# Patient Record
Sex: Male | Born: 1955 | Race: White | Hispanic: No | Marital: Married | Smoking: Never smoker
Health system: Southern US, Community
[De-identification: ages and names within clinical notes are randomized; demographics above are authoritative.]

## PROBLEM LIST (undated history)

## (undated) DIAGNOSIS — K219 Gastro-esophageal reflux disease without esophagitis: Secondary | ICD-10-CM

## (undated) DIAGNOSIS — I10 Essential (primary) hypertension: Secondary | ICD-10-CM

## (undated) DIAGNOSIS — R519 Headache, unspecified: Secondary | ICD-10-CM

## (undated) DIAGNOSIS — R51 Headache: Secondary | ICD-10-CM

## (undated) HISTORY — PX: TONSILLECTOMY: SUR1361

## (undated) HISTORY — PX: OTHER SURGICAL HISTORY: SHX169

## (undated) HISTORY — PX: EYE SURGERY: SHX253

---

## 2000-02-13 ENCOUNTER — Ambulatory Visit (HOSPITAL_BASED_OUTPATIENT_CLINIC_OR_DEPARTMENT_OTHER): Admission: RE | Admit: 2000-02-13 | Discharge: 2000-02-13 | Payer: Self-pay | Admitting: Orthopedic Surgery

## 2002-11-13 ENCOUNTER — Observation Stay (HOSPITAL_COMMUNITY): Admission: EM | Admit: 2002-11-13 | Discharge: 2002-11-14 | Payer: Self-pay | Admitting: Emergency Medicine

## 2004-03-21 ENCOUNTER — Ambulatory Visit (HOSPITAL_BASED_OUTPATIENT_CLINIC_OR_DEPARTMENT_OTHER): Admission: RE | Admit: 2004-03-21 | Discharge: 2004-03-21 | Payer: Self-pay | Admitting: Otolaryngology

## 2010-09-10 DIAGNOSIS — C4491 Basal cell carcinoma of skin, unspecified: Secondary | ICD-10-CM

## 2010-09-10 HISTORY — DX: Basal cell carcinoma of skin, unspecified: C44.91

## 2010-09-15 DIAGNOSIS — D229 Melanocytic nevi, unspecified: Secondary | ICD-10-CM

## 2010-09-15 HISTORY — DX: Melanocytic nevi, unspecified: D22.9

## 2011-04-21 HISTORY — PX: OTHER SURGICAL HISTORY: SHX169

## 2013-09-18 ENCOUNTER — Ambulatory Visit (INDEPENDENT_AMBULATORY_CARE_PROVIDER_SITE_OTHER): Payer: BC Managed Care – PPO | Admitting: Sports Medicine

## 2013-09-18 ENCOUNTER — Encounter: Payer: Self-pay | Admitting: Sports Medicine

## 2013-09-18 VITALS — BP 134/92 | Ht 75.0 in | Wt 283.0 lb

## 2013-09-18 DIAGNOSIS — M216X9 Other acquired deformities of unspecified foot: Secondary | ICD-10-CM

## 2013-09-18 DIAGNOSIS — M25579 Pain in unspecified ankle and joints of unspecified foot: Secondary | ICD-10-CM

## 2013-09-18 NOTE — Progress Notes (Signed)
   Subjective:    Patient ID: Brent Huynh, male    DOB: [birth year], 58 y.o.   MRN: [id number]  HPI chief complaint: Right foot and ankle pain  Pleasant 58 year old male comes in today complaining of right foot and ankle pain. About a month ago when walking his dog he began to experience some excruciating pain along the plantar aspect of his right foot. That pain eventually resolved but he then developed pain along the lateral aspect of his ankle. He took some time off from walking and as a result his pain has improved. He's had problems with both of his feet in the past. He had a partial Achilles "tear" in the right heel as a teenager. He has a surgical history significant for an ORIF of a left fifth metatarsal stress fracture done by Dr. Percell Miller in 2001. He has not noticed any swelling. He denies associated numbness and tingling. He has taken some over-the-counter ibuprofen which has been helpful. He is also complaining of some chronic intermittent right-sided low back pain with occasional radiating pain down the lateral aspect of his hip. He sees a Restaurant manager, fast food which does seem to help with his pain. He has been given some stretches. No groin pain.  Past medical history reviewed Current medications review Allergies reviewed Nonsmoker, drinks occasional alcohol, works as an Forensic psychologist    Review of Systems As above    Objective:   Physical Exam Well-developed, obese. No acute distress. Awake alert and oriented x3. Vital signs reviewed  Right hip: Smooth painless hip range of motion with a negative log roll. No tenderness to palpation over the greater trochanteric bursa. Negative Faber's. Negative straight leg raise. There is some mild weakness with resisted hip abduction.  Right foot: Significant cavus foot. Limited tibiotalar range of motion actively and passively. Tight Achilles tendon but tendon is intact. Good passive subtalar motion. No soft tissue swelling. No bony or soft tissue  tenderness to palpation. Neurovascularly intact distally. Marked supination with walking.       Assessment & Plan:  Supination  I'm going to start with a pair of green sports insoles with lateral heel wedges. Patient has had custom orthotics in the past which were uncomfortable. These sound like they were rigid orthotics and I think that he may do well with a pair of our custom orthotics eventually. However, today, I will just start with adding the lateral heel wedges and he will followup with me in 4 weeks. Addition of the lateral heel wedges does correct his supination somewhat but it is not a full correction. I've asked him to call me with questions or concerns in the interim. He can continue with activity as tolerated. Of note, I've also given him some piriformis and IT band stretches for his chronic right hip problem.

## 2013-10-16 ENCOUNTER — Ambulatory Visit: Payer: BC Managed Care – PPO | Admitting: Sports Medicine

## 2015-01-09 ENCOUNTER — Ambulatory Visit
Admission: RE | Admit: 2015-01-09 | Discharge: 2015-01-09 | Disposition: A | Discharge status: Home / Self Care | Payer: BLUE CROSS/BLUE SHIELD | Source: Ambulatory Visit | Attending: Internal Medicine | Admitting: Internal Medicine

## 2015-01-09 ENCOUNTER — Other Ambulatory Visit: Payer: Self-pay | Admitting: Internal Medicine

## 2015-01-09 DIAGNOSIS — M5489 Other dorsalgia: Secondary | ICD-10-CM

## 2016-01-15 DIAGNOSIS — Z23 Encounter for immunization: Secondary | ICD-10-CM | POA: Diagnosis not present

## 2016-01-15 DIAGNOSIS — T148 Other injury of unspecified body region: Secondary | ICD-10-CM | POA: Diagnosis not present

## 2016-03-05 DIAGNOSIS — Z7189 Other specified counseling: Secondary | ICD-10-CM | POA: Diagnosis not present

## 2016-03-05 DIAGNOSIS — Z23 Encounter for immunization: Secondary | ICD-10-CM | POA: Diagnosis not present

## 2016-04-28 DIAGNOSIS — F325 Major depressive disorder, single episode, in full remission: Secondary | ICD-10-CM | POA: Diagnosis not present

## 2016-04-28 DIAGNOSIS — R7301 Impaired fasting glucose: Secondary | ICD-10-CM | POA: Diagnosis not present

## 2016-04-28 DIAGNOSIS — E782 Mixed hyperlipidemia: Secondary | ICD-10-CM | POA: Diagnosis not present

## 2016-04-28 DIAGNOSIS — Z0000 Encounter for general adult medical examination without abnormal findings: Secondary | ICD-10-CM | POA: Diagnosis not present

## 2016-04-28 DIAGNOSIS — I10 Essential (primary) hypertension: Secondary | ICD-10-CM | POA: Diagnosis not present

## 2016-04-28 DIAGNOSIS — K219 Gastro-esophageal reflux disease without esophagitis: Secondary | ICD-10-CM | POA: Diagnosis not present

## 2016-06-11 ENCOUNTER — Other Ambulatory Visit: Payer: Self-pay | Admitting: Gastroenterology

## 2016-06-30 DIAGNOSIS — H5213 Myopia, bilateral: Secondary | ICD-10-CM | POA: Diagnosis not present

## 2016-06-30 DIAGNOSIS — H52223 Regular astigmatism, bilateral: Secondary | ICD-10-CM | POA: Diagnosis not present

## 2016-06-30 DIAGNOSIS — H524 Presbyopia: Secondary | ICD-10-CM | POA: Diagnosis not present

## 2016-07-22 ENCOUNTER — Encounter (HOSPITAL_COMMUNITY): Payer: Self-pay | Admitting: *Deleted

## 2016-07-23 ENCOUNTER — Encounter (HOSPITAL_COMMUNITY): Payer: Self-pay | Admitting: *Deleted

## 2016-07-27 ENCOUNTER — Ambulatory Visit (HOSPITAL_COMMUNITY): Payer: BLUE CROSS/BLUE SHIELD | Admitting: Registered Nurse

## 2016-07-27 ENCOUNTER — Ambulatory Visit (HOSPITAL_COMMUNITY)
Admission: RE | Admit: 2016-07-27 | Discharge: 2016-07-27 | Disposition: A | Discharge status: Home / Self Care | Payer: BLUE CROSS/BLUE SHIELD | Source: Ambulatory Visit | Attending: Gastroenterology | Admitting: Gastroenterology

## 2016-07-27 ENCOUNTER — Encounter (HOSPITAL_COMMUNITY): Payer: Self-pay | Admitting: *Deleted

## 2016-07-27 ENCOUNTER — Encounter (HOSPITAL_COMMUNITY)
Admission: RE | Disposition: A | Discharge status: Home / Self Care | Payer: Self-pay | Source: Ambulatory Visit | Attending: Gastroenterology

## 2016-07-27 DIAGNOSIS — D122 Benign neoplasm of ascending colon: Secondary | ICD-10-CM | POA: Diagnosis not present

## 2016-07-27 DIAGNOSIS — E7800 Pure hypercholesterolemia, unspecified: Secondary | ICD-10-CM | POA: Insufficient documentation

## 2016-07-27 DIAGNOSIS — Z86010 Personal history of colonic polyps: Secondary | ICD-10-CM | POA: Insufficient documentation

## 2016-07-27 DIAGNOSIS — Z79899 Other long term (current) drug therapy: Secondary | ICD-10-CM | POA: Insufficient documentation

## 2016-07-27 DIAGNOSIS — Z7982 Long term (current) use of aspirin: Secondary | ICD-10-CM | POA: Insufficient documentation

## 2016-07-27 DIAGNOSIS — J302 Other seasonal allergic rhinitis: Secondary | ICD-10-CM | POA: Diagnosis not present

## 2016-07-27 DIAGNOSIS — Z1211 Encounter for screening for malignant neoplasm of colon: Secondary | ICD-10-CM | POA: Insufficient documentation

## 2016-07-27 DIAGNOSIS — Z888 Allergy status to other drugs, medicaments and biological substances status: Secondary | ICD-10-CM | POA: Diagnosis not present

## 2016-07-27 DIAGNOSIS — K219 Gastro-esophageal reflux disease without esophagitis: Secondary | ICD-10-CM | POA: Diagnosis not present

## 2016-07-27 HISTORY — DX: Headache: R51

## 2016-07-27 HISTORY — DX: Headache, unspecified: R51.9

## 2016-07-27 HISTORY — DX: Gastro-esophageal reflux disease without esophagitis: K21.9

## 2016-07-27 HISTORY — PX: COLONOSCOPY WITH PROPOFOL: SHX5780

## 2016-07-27 SURGERY — COLONOSCOPY WITH PROPOFOL
Anesthesia: Monitor Anesthesia Care

## 2016-07-27 MED ORDER — PROPOFOL 10 MG/ML IV BOLUS
INTRAVENOUS | Status: DC | PRN
  Administered 2016-07-27: 40 mg via INTRAVENOUS

## 2016-07-27 MED ORDER — LACTATED RINGERS IV SOLN
INTRAVENOUS | Status: DC
  Administered 2016-07-27: 1000 mL via INTRAVENOUS

## 2016-07-27 MED ORDER — SODIUM CHLORIDE 0.9 % IV SOLN: INTRAVENOUS | Status: DC

## 2016-07-27 MED ORDER — PROPOFOL 10 MG/ML IV BOLUS
INTRAVENOUS | Status: AC
  Filled 2016-07-27: qty 40

## 2016-07-27 MED ORDER — PROPOFOL 500 MG/50ML IV EMUL
INTRAVENOUS | Status: DC | PRN
  Administered 2016-07-27: 140 ug/kg/min via INTRAVENOUS

## 2016-07-27 MED ORDER — PROPOFOL 10 MG/ML IV BOLUS
INTRAVENOUS | Status: AC
  Filled 2016-07-27: qty 20

## 2016-07-27 SURGICAL SUPPLY — 22 items

## 2016-07-27 NOTE — Op Note (Signed)
[facility] Patient Name: Brent Huynh Procedure Date: 07/27/2016 MRN: [id number] Attending MD: Garlan Fair , MD Date of Birth: Mar 17, 1956 CSN: [id number] Age: 61 Admit Type: Outpatient Procedure:                Colonoscopy Indications:              High risk colon cancer surveillance: Personal                            history of adenoma with villous component Providers:                Garlan Fair, MD, Hilma Favors, RN, Corliss Parish, Technician Referring MD:              Medicines:                Propofol per Anesthesia Complications:            No immediate complications. Estimated Blood Loss:     Estimated blood loss was minimal. Procedure:                Pre-Anesthesia Assessment:                           - Prior to the procedure, a History and Physical                            was performed, and patient medications and                            allergies were reviewed. The patient's tolerance of                            previous anesthesia was also reviewed. The risks                            and benefits of the procedure and the sedation                            options and risks were discussed with the patient.                            All questions were answered, and informed consent                            was obtained. Prior Anticoagulants: The patient has                            taken aspirin, last dose was day of procedure. ASA                            Grade Assessment: II - A patient with mild systemic  disease. After reviewing the risks and benefits,                            the patient was deemed in satisfactory condition to                            undergo the procedure.                           After obtaining informed consent, the colonoscope                            was passed under direct vision. Throughout the                            procedure, the  patient's blood pressure, pulse, and                            oxygen saturations were monitored continuously. The                            was introduced through the anus and advanced to the                            the cecum, identified by appendiceal orifice and                            ileocecal valve. The colonoscopy was performed                            without difficulty. The patient tolerated the                            procedure well. The quality of the bowel                            preparation was good. The appendiceal orifice and                            the rectum were photographed. Scope In: 12:33:14 PM Scope Out: 12:50:44 PM Scope Withdrawal Time: 0 hours 10 minutes 59 seconds  Total Procedure Duration: 0 hours 17 minutes 30 seconds  Findings:      The perianal and digital rectal examinations were normal.      A 4 mm polyp was found in the proximal ascending colon. The polyp was       sessile. The polyp was removed with a cold biopsy forceps. Resection and       retrieval were complete.      The exam was otherwise without abnormality. Impression:               - One 4 mm polyp in the proximal ascending colon,                            removed with a cold biopsy forceps. Resected and  retrieved.                           - The examination was otherwise normal. Moderate Sedation:      N/A- Per Anesthesia Care Recommendation:           - Patient has a contact number available for                            emergencies. The signs and symptoms of potential                            delayed complications were discussed with the                            patient. Return to normal activities tomorrow.                            Written discharge instructions were provided to the                            patient.                           - Repeat colonoscopy in 5 years for surveillance.                           - Resume previous  diet.                           - Continue present medications. Procedure Code(s):        --- Professional ---                           [phone], Colonoscopy, flexible; with biopsy, single                            or multiple Diagnosis Code(s):        --- Professional ---                           Z86.010, Personal history of colonic polyps                           D12.2, Benign neoplasm of ascending colon CPT copyright 2016 American Medical Association. All rights reserved. The codes documented in this report are preliminary and upon coder review may  be revised to meet current compliance requirements. Earle Gell, MD Garlan Fair, MD 07/27/2016 12:57:56 PM This report has been signed electronically. Number of Addenda: 0

## 2016-07-27 NOTE — Transfer of Care (Signed)
Immediate Anesthesia Transfer of Care Note  Patient: Brent Huynh  Procedure(s) Performed: Procedure(s): COLONOSCOPY WITH PROPOFOL (N/A)  Patient Location: PACU and Endoscopy Unit  Anesthesia Type:MAC  Level of Consciousness: awake, alert , oriented and patient cooperative  Airway & Oxygen Therapy: Patient Spontanous Breathing and Patient connected to face mask oxygen  Post-op Assessment: Report given to RN, Post -op Vital signs reviewed and stable and Patient moving all extremities  Post vital signs: Reviewed and stable  Last Vitals:  Vitals:   07/27/16 1120  BP: (!) 159/81  Resp: 13  Temp: 36.8 C    Last Pain:  Vitals:   07/27/16 1120  TempSrc: Oral         Complications: No apparent anesthesia complications

## 2016-07-27 NOTE — Anesthesia Preprocedure Evaluation (Signed)
Anesthesia Evaluation  Patient identified by MRN, date of birth, ID band Patient awake    Reviewed: Allergy & Precautions, NPO status , Patient's Chart, lab work & pertinent test results  Airway Mallampati: I  TM Distance: >3 FB Neck ROM: Full    Dental   Pulmonary    Pulmonary exam normal        Cardiovascular Normal cardiovascular exam     Neuro/Psych    GI/Hepatic GERD  Medicated and Controlled,  Endo/Other    Renal/GU      Musculoskeletal   Abdominal   Peds  Hematology   Anesthesia Other Findings   Reproductive/Obstetrics                             Anesthesia Physical Anesthesia Plan  ASA: II  Anesthesia Plan: MAC   Post-op Pain Management:    Induction: Intravenous  Airway Management Planned: Simple Face Mask  Additional Equipment:   Intra-op Plan:   Post-operative Plan:   Informed Consent: I have reviewed the patients History and Physical, chart, labs and discussed the procedure including the risks, benefits and alternatives for the proposed anesthesia with the patient or authorized representative who has indicated his/her understanding and acceptance.     Plan Discussed with: CRNA and Surgeon  Anesthesia Plan Comments:         Anesthesia Quick Evaluation

## 2016-07-27 NOTE — H&P (Signed)
Procedure: Surveillance colonoscopy  History: The patient is a 61 year old male born 11/07/1955. In 2007, he had a tubulovillous adenomatous polyp removed colonoscopically. On 05/07/2011 he underwent a surveillance colonoscopy with removal of a 5 mm tubular adenomatous descending colon polyp. He is scheduled to undergo a repeat surveillance colonoscopy today.  Past medical history: Gastroesophageal reflux. Hypercholesterolemia. Benign prostatic hypertrophy. Seasonal allergic rhinitis. Obstructive sleep apnea syndrome. Left foot fracture surgery.  Medication allergies: Pepto-Bismol caused hives  Exam: The patient is alert and lying comfortably on the endoscopy stretcher. Abdomen is soft and nontender to palpation. Lungs are clear to auscultation. Cardiac exam reveals a regular rhythm.  Plan: Proceed with surveillance colonoscopy

## 2016-07-27 NOTE — Discharge Instructions (Signed)

## 2016-07-27 NOTE — Anesthesia Postprocedure Evaluation (Signed)
Anesthesia Post Note  Patient: Brent Huynh  Procedure(s) Performed: Procedure(s) (LRB): COLONOSCOPY WITH PROPOFOL (N/A)  Patient location during evaluation: PACU Anesthesia Type: MAC Level of consciousness: awake and alert Pain management: pain level controlled Vital Signs Assessment: post-procedure vital signs reviewed and stable Respiratory status: spontaneous breathing, nonlabored ventilation, respiratory function stable and patient connected to nasal cannula oxygen Cardiovascular status: stable and blood pressure returned to baseline Anesthetic complications: no       Last Vitals:  Vitals:   07/27/16 1315 07/27/16 1320  BP:  127/73  Pulse: (!) 55 (!) 57  Resp: (!) 23 11  Temp:      Last Pain:  Vitals:   07/27/16 1300  TempSrc: Oral                 Adelee Hannula DAVID

## 2016-07-28 ENCOUNTER — Encounter (HOSPITAL_COMMUNITY): Payer: Self-pay | Admitting: Gastroenterology

## 2016-10-27 DIAGNOSIS — R030 Elevated blood-pressure reading, without diagnosis of hypertension: Secondary | ICD-10-CM | POA: Diagnosis not present

## 2016-10-27 DIAGNOSIS — Z23 Encounter for immunization: Secondary | ICD-10-CM | POA: Diagnosis not present

## 2016-11-16 DIAGNOSIS — L729 Follicular cyst of the skin and subcutaneous tissue, unspecified: Secondary | ICD-10-CM | POA: Diagnosis not present

## 2016-12-02 ENCOUNTER — Other Ambulatory Visit: Payer: Self-pay | Admitting: Dermatology

## 2016-12-02 DIAGNOSIS — D492 Neoplasm of unspecified behavior of bone, soft tissue, and skin: Secondary | ICD-10-CM | POA: Diagnosis not present

## 2017-01-15 DIAGNOSIS — Z23 Encounter for immunization: Secondary | ICD-10-CM | POA: Diagnosis not present

## 2017-05-06 ENCOUNTER — Ambulatory Visit
Admission: RE | Admit: 2017-05-06 | Discharge: 2017-05-06 | Disposition: A | Discharge status: Home / Self Care | Payer: BLUE CROSS/BLUE SHIELD | Source: Ambulatory Visit | Attending: Internal Medicine | Admitting: Internal Medicine

## 2017-05-06 ENCOUNTER — Other Ambulatory Visit: Payer: Self-pay | Admitting: Internal Medicine

## 2017-05-06 DIAGNOSIS — M19042 Primary osteoarthritis, left hand: Secondary | ICD-10-CM | POA: Diagnosis not present

## 2017-05-06 DIAGNOSIS — Z0000 Encounter for general adult medical examination without abnormal findings: Secondary | ICD-10-CM | POA: Diagnosis not present

## 2017-05-06 DIAGNOSIS — Z5181 Encounter for therapeutic drug level monitoring: Secondary | ICD-10-CM | POA: Diagnosis not present

## 2017-05-06 DIAGNOSIS — R7301 Impaired fasting glucose: Secondary | ICD-10-CM | POA: Diagnosis not present

## 2017-05-06 DIAGNOSIS — M79642 Pain in left hand: Secondary | ICD-10-CM

## 2017-05-06 DIAGNOSIS — E782 Mixed hyperlipidemia: Secondary | ICD-10-CM | POA: Diagnosis not present

## 2017-05-06 DIAGNOSIS — R002 Palpitations: Secondary | ICD-10-CM | POA: Diagnosis not present

## 2017-05-06 DIAGNOSIS — K219 Gastro-esophageal reflux disease without esophagitis: Secondary | ICD-10-CM | POA: Diagnosis not present

## 2017-05-06 DIAGNOSIS — M79641 Pain in right hand: Secondary | ICD-10-CM | POA: Diagnosis not present

## 2017-05-06 DIAGNOSIS — Z125 Encounter for screening for malignant neoplasm of prostate: Secondary | ICD-10-CM | POA: Diagnosis not present

## 2017-07-06 DIAGNOSIS — H5213 Myopia, bilateral: Secondary | ICD-10-CM | POA: Diagnosis not present

## 2017-07-06 DIAGNOSIS — H52223 Regular astigmatism, bilateral: Secondary | ICD-10-CM | POA: Diagnosis not present

## 2017-07-06 DIAGNOSIS — H524 Presbyopia: Secondary | ICD-10-CM | POA: Diagnosis not present

## 2017-08-27 DIAGNOSIS — M5136 Other intervertebral disc degeneration, lumbar region: Secondary | ICD-10-CM | POA: Diagnosis not present

## 2017-08-27 DIAGNOSIS — M545 Low back pain: Secondary | ICD-10-CM | POA: Diagnosis not present

## 2017-08-27 DIAGNOSIS — M4316 Spondylolisthesis, lumbar region: Secondary | ICD-10-CM | POA: Diagnosis not present

## 2017-09-01 DIAGNOSIS — M545 Low back pain: Secondary | ICD-10-CM | POA: Diagnosis not present

## 2017-09-16 DIAGNOSIS — M545 Low back pain: Secondary | ICD-10-CM | POA: Diagnosis not present

## 2017-09-29 DIAGNOSIS — M545 Low back pain: Secondary | ICD-10-CM | POA: Diagnosis not present

## 2017-10-06 DIAGNOSIS — M545 Low back pain: Secondary | ICD-10-CM | POA: Diagnosis not present

## 2017-10-13 DIAGNOSIS — M545 Low back pain: Secondary | ICD-10-CM | POA: Diagnosis not present

## 2017-10-19 DIAGNOSIS — M545 Low back pain: Secondary | ICD-10-CM | POA: Diagnosis not present

## 2017-10-27 DIAGNOSIS — M545 Low back pain: Secondary | ICD-10-CM | POA: Diagnosis not present

## 2017-11-10 DIAGNOSIS — M545 Low back pain: Secondary | ICD-10-CM | POA: Diagnosis not present

## 2018-01-27 DIAGNOSIS — Z23 Encounter for immunization: Secondary | ICD-10-CM | POA: Diagnosis not present

## 2018-03-29 DIAGNOSIS — T161XXA Foreign body in right ear, initial encounter: Secondary | ICD-10-CM | POA: Diagnosis not present

## 2018-05-23 DIAGNOSIS — E782 Mixed hyperlipidemia: Secondary | ICD-10-CM | POA: Diagnosis not present

## 2018-05-23 DIAGNOSIS — F325 Major depressive disorder, single episode, in full remission: Secondary | ICD-10-CM | POA: Diagnosis not present

## 2018-05-23 DIAGNOSIS — K219 Gastro-esophageal reflux disease without esophagitis: Secondary | ICD-10-CM | POA: Diagnosis not present

## 2018-05-23 DIAGNOSIS — R7301 Impaired fasting glucose: Secondary | ICD-10-CM | POA: Diagnosis not present

## 2018-05-23 DIAGNOSIS — Z0000 Encounter for general adult medical examination without abnormal findings: Secondary | ICD-10-CM | POA: Diagnosis not present

## 2018-05-23 DIAGNOSIS — E669 Obesity, unspecified: Secondary | ICD-10-CM | POA: Diagnosis not present

## 2018-05-23 DIAGNOSIS — Z23 Encounter for immunization: Secondary | ICD-10-CM | POA: Diagnosis not present

## 2018-08-16 DIAGNOSIS — R202 Paresthesia of skin: Secondary | ICD-10-CM | POA: Diagnosis not present

## 2018-08-18 DIAGNOSIS — R739 Hyperglycemia, unspecified: Secondary | ICD-10-CM | POA: Diagnosis not present

## 2018-08-18 DIAGNOSIS — R202 Paresthesia of skin: Secondary | ICD-10-CM | POA: Diagnosis not present

## 2018-08-25 DIAGNOSIS — Z23 Encounter for immunization: Secondary | ICD-10-CM | POA: Diagnosis not present

## 2018-08-25 DIAGNOSIS — E1142 Type 2 diabetes mellitus with diabetic polyneuropathy: Secondary | ICD-10-CM | POA: Diagnosis not present

## 2018-08-25 DIAGNOSIS — N529 Male erectile dysfunction, unspecified: Secondary | ICD-10-CM | POA: Diagnosis not present

## 2018-09-13 DIAGNOSIS — H524 Presbyopia: Secondary | ICD-10-CM | POA: Diagnosis not present

## 2018-09-13 DIAGNOSIS — H52223 Regular astigmatism, bilateral: Secondary | ICD-10-CM | POA: Diagnosis not present

## 2018-09-13 DIAGNOSIS — H5213 Myopia, bilateral: Secondary | ICD-10-CM | POA: Diagnosis not present

## 2018-09-29 DIAGNOSIS — M13841 Other specified arthritis, right hand: Secondary | ICD-10-CM | POA: Diagnosis not present

## 2018-09-29 DIAGNOSIS — M79641 Pain in right hand: Secondary | ICD-10-CM | POA: Diagnosis not present

## 2018-09-29 DIAGNOSIS — G5602 Carpal tunnel syndrome, left upper limb: Secondary | ICD-10-CM | POA: Diagnosis not present

## 2018-10-10 DIAGNOSIS — G5602 Carpal tunnel syndrome, left upper limb: Secondary | ICD-10-CM | POA: Diagnosis not present

## 2018-10-14 DIAGNOSIS — M13841 Other specified arthritis, right hand: Secondary | ICD-10-CM | POA: Diagnosis not present

## 2018-10-14 DIAGNOSIS — G5602 Carpal tunnel syndrome, left upper limb: Secondary | ICD-10-CM | POA: Diagnosis not present

## 2018-11-24 DIAGNOSIS — E1142 Type 2 diabetes mellitus with diabetic polyneuropathy: Secondary | ICD-10-CM | POA: Diagnosis not present

## 2018-11-28 DIAGNOSIS — E1142 Type 2 diabetes mellitus with diabetic polyneuropathy: Secondary | ICD-10-CM | POA: Diagnosis not present

## 2018-11-28 DIAGNOSIS — Z7984 Long term (current) use of oral hypoglycemic drugs: Secondary | ICD-10-CM | POA: Diagnosis not present

## 2019-01-17 DIAGNOSIS — L720 Epidermal cyst: Secondary | ICD-10-CM | POA: Diagnosis not present

## 2019-01-17 DIAGNOSIS — D229 Melanocytic nevi, unspecified: Secondary | ICD-10-CM | POA: Diagnosis not present

## 2019-01-17 DIAGNOSIS — L821 Other seborrheic keratosis: Secondary | ICD-10-CM | POA: Diagnosis not present

## 2019-01-19 DIAGNOSIS — Z23 Encounter for immunization: Secondary | ICD-10-CM | POA: Diagnosis not present

## 2019-03-31 ENCOUNTER — Other Ambulatory Visit: Payer: Self-pay

## 2019-03-31 DIAGNOSIS — Z20822 Contact with and (suspected) exposure to covid-19: Secondary | ICD-10-CM

## 2019-04-03 LAB — NOVEL CORONAVIRUS, NAA: SARS-CoV-2, NAA: NOT DETECTED

## 2019-05-25 DIAGNOSIS — K219 Gastro-esophageal reflux disease without esophagitis: Secondary | ICD-10-CM | POA: Diagnosis not present

## 2019-05-25 DIAGNOSIS — N401 Enlarged prostate with lower urinary tract symptoms: Secondary | ICD-10-CM | POA: Diagnosis not present

## 2019-05-25 DIAGNOSIS — Z125 Encounter for screening for malignant neoplasm of prostate: Secondary | ICD-10-CM | POA: Diagnosis not present

## 2019-05-25 DIAGNOSIS — Z0000 Encounter for general adult medical examination without abnormal findings: Secondary | ICD-10-CM | POA: Diagnosis not present

## 2019-05-25 DIAGNOSIS — E1142 Type 2 diabetes mellitus with diabetic polyneuropathy: Secondary | ICD-10-CM | POA: Diagnosis not present

## 2019-05-25 DIAGNOSIS — E782 Mixed hyperlipidemia: Secondary | ICD-10-CM | POA: Diagnosis not present

## 2019-05-25 DIAGNOSIS — Z1159 Encounter for screening for other viral diseases: Secondary | ICD-10-CM | POA: Diagnosis not present

## 2019-07-13 DIAGNOSIS — Z03818 Encounter for observation for suspected exposure to other biological agents ruled out: Secondary | ICD-10-CM | POA: Diagnosis not present

## 2019-07-13 DIAGNOSIS — Z20828 Contact with and (suspected) exposure to other viral communicable diseases: Secondary | ICD-10-CM | POA: Diagnosis not present

## 2019-07-14 DIAGNOSIS — Z20828 Contact with and (suspected) exposure to other viral communicable diseases: Secondary | ICD-10-CM | POA: Diagnosis not present

## 2019-08-24 DIAGNOSIS — E1142 Type 2 diabetes mellitus with diabetic polyneuropathy: Secondary | ICD-10-CM | POA: Diagnosis not present

## 2019-09-24 IMAGING — DX DG HAND 2V*L*
2 series · 2 of 2 positions shown · non-contrast
Comparison: None.

CLINICAL DATA: Chronic multifocal joint pain.  No known injury.

EXAM:
LEFT HAND - 2 VIEW

[dg hand 2 view left (1 of 2)]
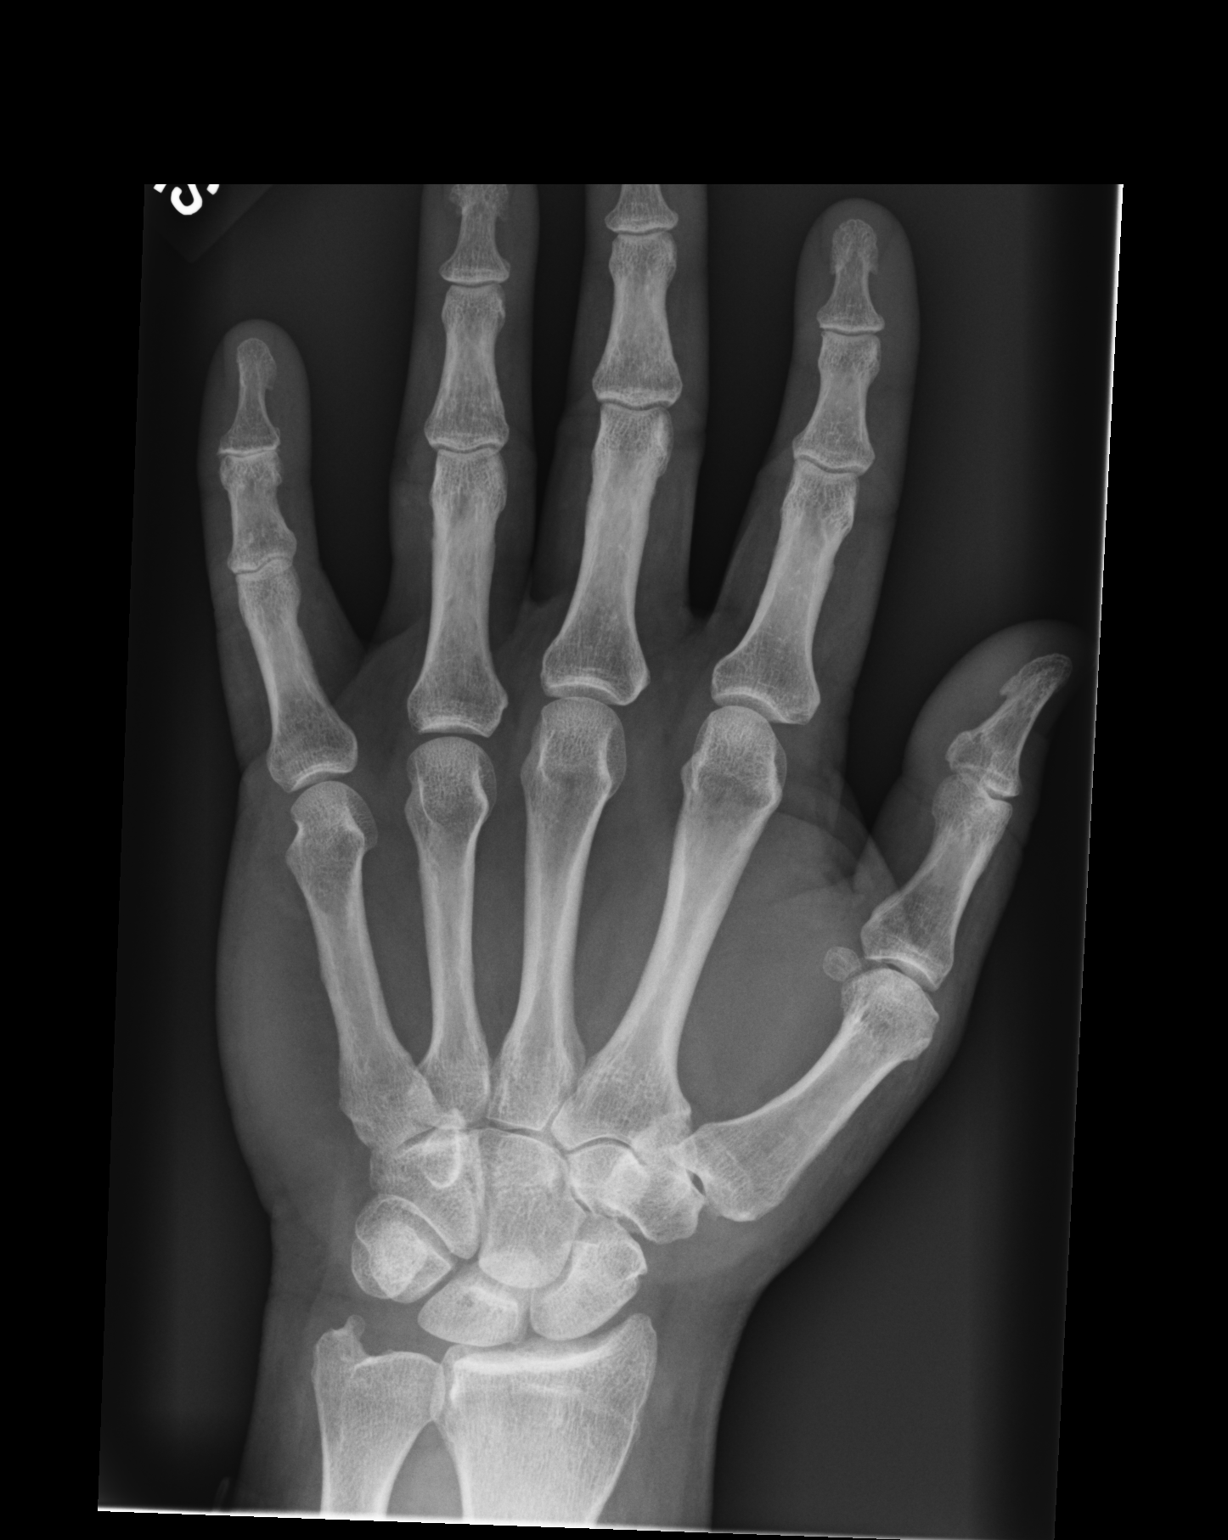

[dg hand 2 view left (2 of 2)]
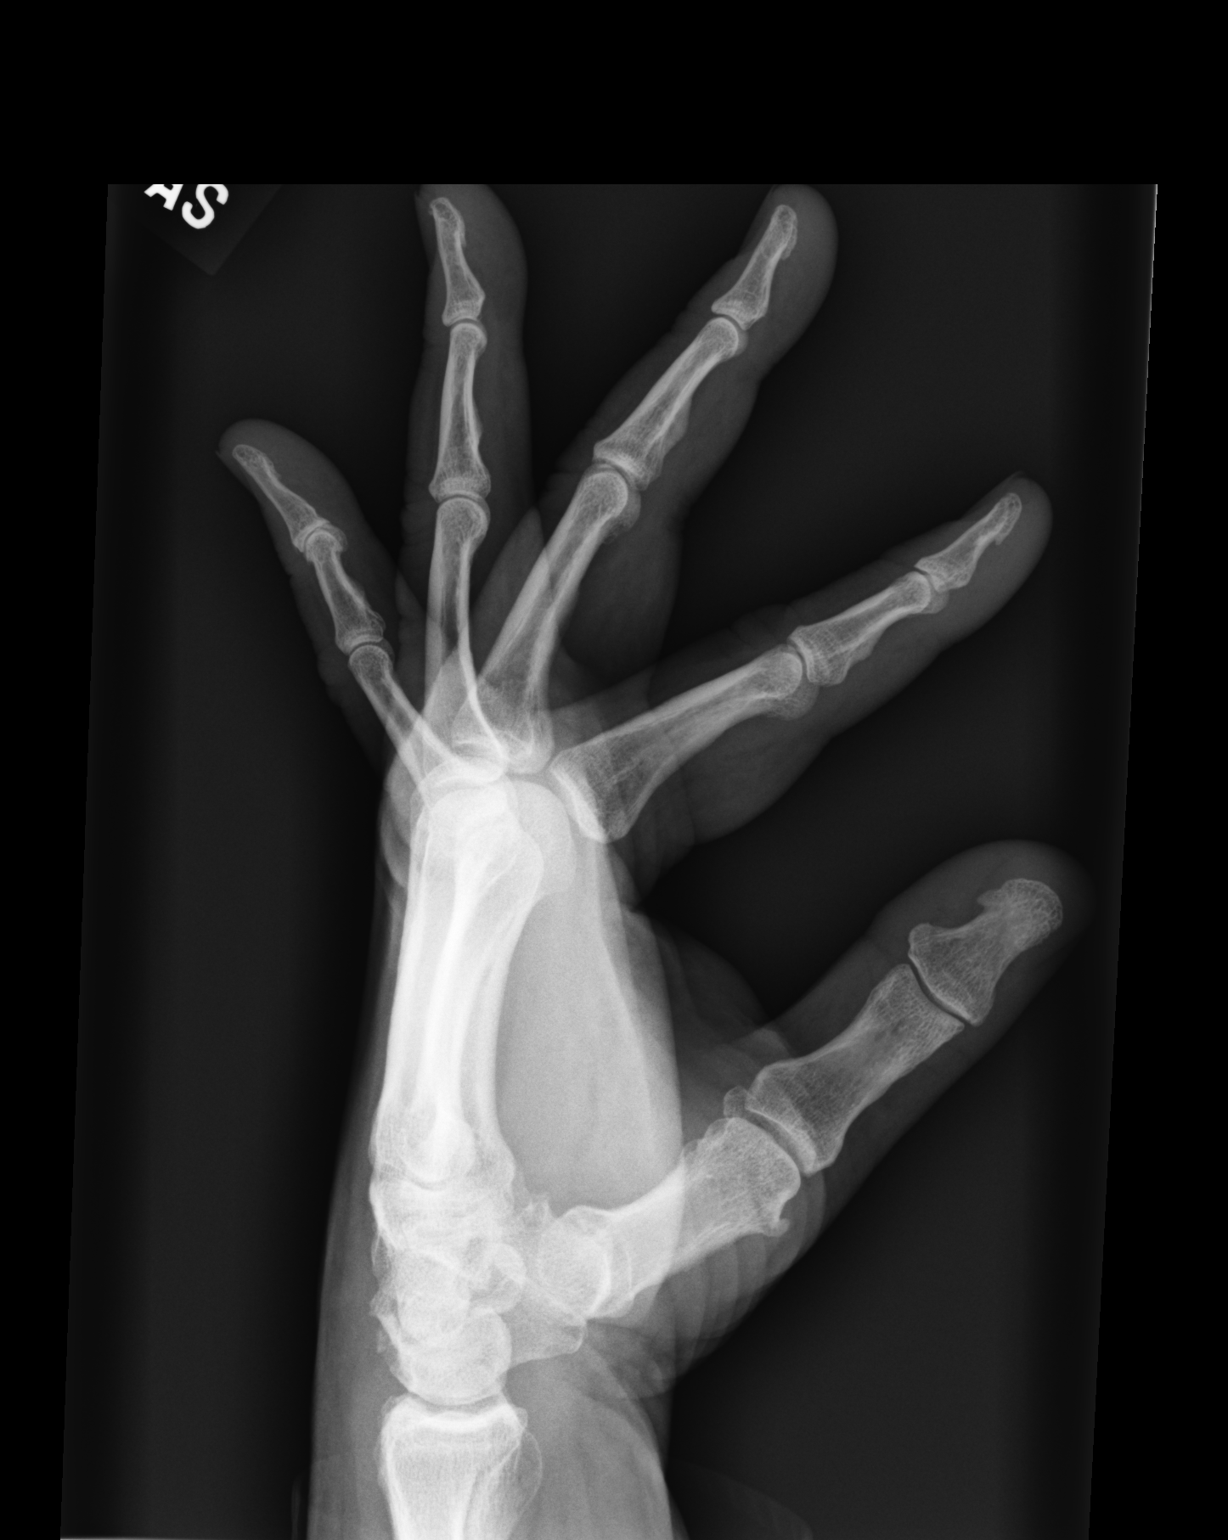

[2 of 2 positions shown; findings below may reference images not displayed]

FINDINGS: There is some joint space narrowing and osteophytosis at the first
CMC joint and DIP joint of the little finger. Joint spaces are
otherwise preserved. Mineralization and alignment are normal. No
erosion or periostitis. No fracture or dislocation. Soft tissues
appear normal.
IMPRESSION: Mild to moderate appearing osteoarthritis DIP joint left little
finger and first CMC joint. The exam is otherwise negative.

## 2019-11-23 DIAGNOSIS — K219 Gastro-esophageal reflux disease without esophagitis: Secondary | ICD-10-CM | POA: Diagnosis not present

## 2019-11-23 DIAGNOSIS — E1142 Type 2 diabetes mellitus with diabetic polyneuropathy: Secondary | ICD-10-CM | POA: Diagnosis not present

## 2019-11-23 DIAGNOSIS — F325 Major depressive disorder, single episode, in full remission: Secondary | ICD-10-CM | POA: Diagnosis not present

## 2020-02-15 ENCOUNTER — Ambulatory Visit (INDEPENDENT_AMBULATORY_CARE_PROVIDER_SITE_OTHER): Payer: BC Managed Care – PPO | Admitting: Sports Medicine

## 2020-02-15 ENCOUNTER — Other Ambulatory Visit: Payer: Self-pay

## 2020-02-15 VITALS — BP 162/97 | Ht 75.0 in | Wt 286.0 lb

## 2020-02-15 DIAGNOSIS — M25562 Pain in left knee: Secondary | ICD-10-CM

## 2020-02-15 MED ORDER — MELOXICAM 15 MG PO TABS: ORAL_TABLET | ORAL | 0 refills | Status: DC

## 2020-02-16 ENCOUNTER — Encounter: Payer: Self-pay | Admitting: Sports Medicine

## 2020-02-16 ENCOUNTER — Ambulatory Visit
Admission: RE | Admit: 2020-02-16 | Discharge: 2020-02-16 | Disposition: A | Discharge status: Home / Self Care | Payer: BC Managed Care – PPO | Source: Ambulatory Visit | Attending: Sports Medicine | Admitting: Sports Medicine

## 2020-02-16 DIAGNOSIS — M1712 Unilateral primary osteoarthritis, left knee: Secondary | ICD-10-CM | POA: Diagnosis not present

## 2020-02-16 DIAGNOSIS — M25562 Pain in left knee: Secondary | ICD-10-CM

## 2020-02-16 NOTE — Progress Notes (Signed)
   Subjective:    Patient ID: Brent Huynh, male    DOB: November 10, 1955, 64 y.o.   MRN: [id number]  HPI chief complaint: Left knee pain  Brent Huynh comes in today complaining of approximately 6 months of intermittent left knee pain.  He first noticed discomfort in the knee when walking in the woods.  He initially had "sharp flashes of pain" which have now subsided.  Now his main complaint is stiffness with prolonged sitting.  He did recently start increasing the amount of walking he was doing for exercise.  He denies any significant pain during exercise.  He denies instability.  No locking, catching, or popping.  He does notice some discomfort when stepping up a stair or going up a hill.  He notices that if he takes Advil before walking this does seem to help.  He is unsure whether or not he is had any swelling.  No prior knee surgeries.  No significant knee injuries in the past.  Past medical history reviewed Medications reviewed Allergies reviewed    Review of Systems    As above Objective:   Physical Exam  Well-developed, well-nourished.  No acute distress.  Awake alert and oriented x3.  Left knee: Full range of motion.  No obvious effusion.  2+ patellofemoral crepitus.  There is no tenderness to palpation along medial or lateral joint lines.  Negative McMurray's.  Negative Thessaly's.  Knee is stable to valgus and varus stressing.  Negative anterior drawer, negative posterior drawer.  Neurovascularly intact distally.  Limited MSK ultrasound of the left knee shows no obvious joint effusion.  No Baker's cyst.  Visualized portion of the medial and lateral menisci appear unremarkable.      Assessment & Plan:   Left knee pain and stiffness likely secondary to DJD  Patient's main complaint is stiffness with prolonged sitting.  This sounds like osteoarthritis of the knee.  I would like to get x-rays to evaluate further.  In the meantime we will go ahead and start treatment with meloxicam 15 mg  daily for 7 days then as needed.  He will take this medicine with food.  I have also recommended a compression sleeve with exercise.  He'll start daily isometric quad exercises and will follow up with me again in 4 weeks.  If symptoms persist, consider merits of cortisone injection.  Call with questions or concerns in the interim.

## 2020-02-27 DIAGNOSIS — E1142 Type 2 diabetes mellitus with diabetic polyneuropathy: Secondary | ICD-10-CM | POA: Diagnosis not present

## 2020-03-13 ENCOUNTER — Other Ambulatory Visit: Payer: Self-pay | Admitting: Sports Medicine

## 2020-03-19 ENCOUNTER — Ambulatory Visit: Payer: BC Managed Care – PPO | Admitting: Sports Medicine

## 2020-03-28 ENCOUNTER — Ambulatory Visit (INDEPENDENT_AMBULATORY_CARE_PROVIDER_SITE_OTHER): Payer: BC Managed Care – PPO | Admitting: Sports Medicine

## 2020-03-28 ENCOUNTER — Other Ambulatory Visit: Payer: Self-pay

## 2020-03-28 VITALS — BP 142/80 | Ht 75.0 in | Wt 285.0 lb

## 2020-03-28 DIAGNOSIS — M1712 Unilateral primary osteoarthritis, left knee: Secondary | ICD-10-CM | POA: Diagnosis not present

## 2020-03-28 NOTE — Progress Notes (Signed)
Patient ID: Zell Hylton, male   DOB: Mar 15, 1956, 64 y.o.   MRN: [id number]  Conard comes in today for follow-up on left knee pain.  Overall, it is improving.  X-rays of his left knee show moderate degenerative changes, primarily in the lateral compartment.  He noticed significant relief when on Mobic for 7 straight days.  He has now weaned to taking it as needed.  He has been compliant with his home exercises and he wears his knee sleeve with activity.  He was able to golf a few days ago without any significant pain.  He still endorses primarily stiffness more so than pain.  Physical exam was not repeated today.  We simply reviewed his x-rays and discussed treatment going forward.  At this point in time I recommend that we continue with our current plan.  I will refill his meloxicam to take as needed and we will add hip abductor strengthening exercises to his isometric quad exercises.  He will continue with his knee sleeve when active.  If his symptoms worsen, he will return to the office for consideration of a cortisone injection.  Follow-up as needed.

## 2020-04-04 ENCOUNTER — Other Ambulatory Visit: Payer: Self-pay | Admitting: Sports Medicine

## 2020-04-08 ENCOUNTER — Other Ambulatory Visit: Payer: Self-pay | Admitting: Sports Medicine

## 2020-04-08 MED ORDER — MELOXICAM 15 MG PO TABS: ORAL_TABLET | ORAL | 0 refills | Status: DC

## 2020-04-22 DIAGNOSIS — H6120 Impacted cerumen, unspecified ear: Secondary | ICD-10-CM | POA: Diagnosis not present

## 2020-04-22 DIAGNOSIS — H9201 Otalgia, right ear: Secondary | ICD-10-CM | POA: Diagnosis not present

## 2020-05-02 DIAGNOSIS — H6123 Impacted cerumen, bilateral: Secondary | ICD-10-CM | POA: Diagnosis not present

## 2020-05-07 DIAGNOSIS — M26621 Arthralgia of right temporomandibular joint: Secondary | ICD-10-CM | POA: Diagnosis not present

## 2020-06-02 ENCOUNTER — Other Ambulatory Visit: Payer: Self-pay | Admitting: Sports Medicine

## 2020-07-03 DIAGNOSIS — K219 Gastro-esophageal reflux disease without esophagitis: Secondary | ICD-10-CM | POA: Diagnosis not present

## 2020-07-03 DIAGNOSIS — F325 Major depressive disorder, single episode, in full remission: Secondary | ICD-10-CM | POA: Diagnosis not present

## 2020-07-03 DIAGNOSIS — E782 Mixed hyperlipidemia: Secondary | ICD-10-CM | POA: Diagnosis not present

## 2020-07-03 DIAGNOSIS — Z0000 Encounter for general adult medical examination without abnormal findings: Secondary | ICD-10-CM | POA: Diagnosis not present

## 2020-07-03 DIAGNOSIS — E1142 Type 2 diabetes mellitus with diabetic polyneuropathy: Secondary | ICD-10-CM | POA: Diagnosis not present

## 2020-07-03 DIAGNOSIS — Z23 Encounter for immunization: Secondary | ICD-10-CM | POA: Diagnosis not present

## 2020-07-25 DIAGNOSIS — E1142 Type 2 diabetes mellitus with diabetic polyneuropathy: Secondary | ICD-10-CM | POA: Diagnosis not present

## 2020-12-02 DIAGNOSIS — U071 COVID-19: Secondary | ICD-10-CM | POA: Diagnosis not present

## 2021-01-07 ENCOUNTER — Ambulatory Visit (INDEPENDENT_AMBULATORY_CARE_PROVIDER_SITE_OTHER): Payer: BC Managed Care – PPO | Admitting: Sports Medicine

## 2021-01-07 ENCOUNTER — Ambulatory Visit
Admission: RE | Admit: 2021-01-07 | Discharge: 2021-01-07 | Disposition: A | Discharge status: Home / Self Care | Payer: BC Managed Care – PPO | Source: Ambulatory Visit | Attending: Sports Medicine | Admitting: Sports Medicine

## 2021-01-07 ENCOUNTER — Other Ambulatory Visit: Payer: Self-pay

## 2021-01-07 ENCOUNTER — Encounter: Payer: Self-pay | Admitting: Sports Medicine

## 2021-01-07 VITALS — Ht 75.0 in | Wt 265.0 lb

## 2021-01-07 DIAGNOSIS — E1142 Type 2 diabetes mellitus with diabetic polyneuropathy: Secondary | ICD-10-CM | POA: Diagnosis not present

## 2021-01-07 DIAGNOSIS — M1712 Unilateral primary osteoarthritis, left knee: Secondary | ICD-10-CM

## 2021-01-07 DIAGNOSIS — M25551 Pain in right hip: Secondary | ICD-10-CM

## 2021-01-07 DIAGNOSIS — N400 Enlarged prostate without lower urinary tract symptoms: Secondary | ICD-10-CM | POA: Diagnosis not present

## 2021-01-07 DIAGNOSIS — S8992XA Unspecified injury of left lower leg, initial encounter: Secondary | ICD-10-CM

## 2021-01-07 DIAGNOSIS — F325 Major depressive disorder, single episode, in full remission: Secondary | ICD-10-CM | POA: Diagnosis not present

## 2021-01-07 DIAGNOSIS — M25462 Effusion, left knee: Secondary | ICD-10-CM | POA: Diagnosis not present

## 2021-01-07 DIAGNOSIS — K219 Gastro-esophageal reflux disease without esophagitis: Secondary | ICD-10-CM | POA: Diagnosis not present

## 2021-01-07 MED ORDER — MELOXICAM 15 MG PO TABS: ORAL_TABLET | ORAL | 0 refills | Status: DC

## 2021-01-07 NOTE — Progress Notes (Signed)
PCP: Lavone Orn, MD  Subjective:   HPI: Patient is a 65 y.o. male here for follow up for knee pain and hip pain.  Left knee pain Patient states that for the last 2 months, he has been experiencing worsening stiffness in his left knee.  He notes that the stiffness occurs especially after prolonged sitting.  He also reports some pain with walking.  He reports that pain is localized to the anterior aspect of his left knee.  He reports that in the past he has had complete relief of his knee pain while taking meloxicam.  He in addition to stiffness, he also feels that his knee has appeared swollen and unstable.  He has been taking ibuprofen over-the-counter for pain as he reports that he was unsure of how he should take the meloxicam as it was not in the acute setting of his pain onset.  Patient also adds that 1 month ago he had a fall in his garage and landed directly on his left knee on a concrete surface.  He reports that he had ecchymosis and swelling at that time and was not evaluated for x-rays following the fall.  He is continue to have pain after this.  Patient reports that he walks 1.5 to 3 miles daily with his dog through the woods and does have pain with this.  Right hip pain Patient reports that he has been having pain in his right hip for the last 2 months.  He reports stiffness and has pain whenever his hips and shoulders are not aligned.  He states that it can be bothersome for him at night.  He reports intermittent groin pain at times but states that most of his pain is lateral.  He states that sometimes while lying on his left side in bed he will feel a stretching sensation in his lateral hip area.  He has groin pain at times with walking or doing physical activity.  He has not had any clunking sounds in his hip.  He denies having any x-rays or advanced imaging of his hip.  Patient denies trauma to the right hip.      Objective:  Physical Exam:  Gen: awake, alert, NAD, comfortable  in exam room Pulm: breathing unlabored  Knee: - Inspection: no gross deformity. No swelling/effusion, erythema or bruising. Skin intact - Palpation: no TTP - ROM: Limited left knee ROM with flexion and extension in comparison to the right, limiting range of motion of right hip internal and external rotation - Strength: 5/5 strength - Neuro/vasc: NV intact - Special Tests: - LIGAMENTS: negative anterior and posterior drawer, negative Lachman's, no MCL or LCL laxity  -- MENISCUS: negative McMurray's, negative Thessaly  -- PF JOINT: nml patellar mobility bilaterally.  negative patellar grind  Hip:  - Inspection: No gross deformity - Palpation: No TTP, specifically none over greater trochanter - ROM: Normal range of motion on Flexion, abduction, internal and external rotation - Strength: Normal strength. - Neuro/vasc: NV intact distally - Special Tests: Negative FABER and FADIR.  Negative Trendelenberg.     Assessment & Plan:    Left knee injury, initial encounter Patient with history of osteoarthritis.  Has been experiencing increased stiffness and pain in anterior knee however recently had a fall landing on left knee so will have patient obtain x-rays today.  For his I feel arthritis, we have discussed steroid injections as long as x-rays are normal.  We will also do another trial of 15 mg of meloxicam  as patient reports complete relief of knee pain with meloxicam before.  Patient planning trip to [place] in the next week.  We will have him follow-up after his return to evaluate knee pain.  We will follow-up with x-ray results once available.  Right hip pain Patient localizes pain to area of greater trochanter more so than groin pain that would increase suspicion for hip pathology.  We will start with x-rays of right hip as well as Tylenol meloxicam.  Patient will follow-up after return from trip to [place].    Orders Placed This Encounter  Procedures   DG Knee AP/LAT W/Sunrise  Left    Standing Status:   Future    Standing Expiration Date:   01/07/2022    Order Specific Question:   Reason for Exam (SYMPTOM  OR DIAGNOSIS REQUIRED)    Answer:   standing AP, lateral and sunrise views    Order Specific Question:   Preferred imaging location?    Answer:   [facility]    Order Specific Question:   Radiology Contrast Protocol - do NOT remove file path    Answer:   [email or web address]   DG HIP UNILAT WITH PELVIS 2-3 VIEWS RIGHT    Standing Status:   Future    Standing Expiration Date:   01/07/2022    Order Specific Question:   Reason for Exam (SYMPTOM  OR DIAGNOSIS REQUIRED)    Answer:   right hip pain    Order Specific Question:   Preferred imaging location?    Answer:   [facility]    Meds ordered this encounter  Medications   meloxicam (MOBIC) 15 MG tablet    Sig: TAKE 1 TABLET BY MOUTH DAILY WITH FOOD AS NEEDED FOR PAIN    Dispense:  30 tablet    Refill:  0    Eulis Foster, MD [place], PGY-3 01/07/2021 11:37 AM   Patient seen and evaluated with the resident.  I agree with the above plan of care.  Review of the patient's left knee x-ray does not show any fracture.  Right hip x-rays do show some moderate degenerative changes.  Treat with meloxicam for the next several days.  If he does not get the same improvement with his left knee pain that he did previously then return to the office for consideration of a cortisone injection.

## 2021-01-07 NOTE — Assessment & Plan Note (Signed)
Patient localizes pain to area of greater trochanter more so than groin pain that would increase suspicion for hip pathology.  We will start with x-rays of right hip as well as Tylenol meloxicam.  Patient will follow-up after return from trip to [place].

## 2021-01-07 NOTE — Assessment & Plan Note (Signed)
Patient with history of osteoarthritis.  Has been experiencing increased stiffness and pain in anterior knee however recently had a fall landing on left knee so will have patient obtain x-rays today.  For his I feel arthritis, we have discussed steroid injections as long as x-rays are normal.  We will also do another trial of 15 mg of meloxicam as patient reports complete relief of knee pain with meloxicam before.  Patient planning trip to [place] in the next week.  We will have him follow-up after his return to evaluate knee pain.  We will follow-up with x-ray results once available.

## 2021-01-07 NOTE — Patient Instructions (Signed)
  Please go to [facility] located at the address below to have x-rays of your knee and hip.  Dr. Micheline Chapman follow-up with you once these results are available.  [facility] [place]   We will also send a prescription for meloxicam 15 mg.  Please take this daily until your follow-up appointment.  You will also need to avoid taking Advil or ibuprofen while taking meloxicam.  Following your return from [place], please call our office to schedule an appointment in the next few weeks to follow-up on your knee and hip pain.  At that time, we will we can further discuss whether he should proceed with MRI of the knee or consider steroid injection.

## 2021-03-10 DIAGNOSIS — R300 Dysuria: Secondary | ICD-10-CM | POA: Diagnosis not present

## 2021-03-21 ENCOUNTER — Other Ambulatory Visit: Payer: Self-pay | Admitting: Sports Medicine

## 2021-03-25 ENCOUNTER — Ambulatory Visit (INDEPENDENT_AMBULATORY_CARE_PROVIDER_SITE_OTHER): Payer: BC Managed Care – PPO | Admitting: Dermatology

## 2021-03-25 ENCOUNTER — Encounter: Payer: Self-pay | Admitting: Dermatology

## 2021-03-25 ENCOUNTER — Other Ambulatory Visit: Payer: Self-pay

## 2021-03-25 DIAGNOSIS — Z1283 Encounter for screening for malignant neoplasm of skin: Secondary | ICD-10-CM | POA: Diagnosis not present

## 2021-03-25 DIAGNOSIS — L720 Epidermal cyst: Secondary | ICD-10-CM

## 2021-03-25 DIAGNOSIS — L821 Other seborrheic keratosis: Secondary | ICD-10-CM | POA: Diagnosis not present

## 2021-03-25 DIAGNOSIS — D1801 Hemangioma of skin and subcutaneous tissue: Secondary | ICD-10-CM

## 2021-03-25 DIAGNOSIS — L814 Other melanin hyperpigmentation: Secondary | ICD-10-CM

## 2021-03-25 DIAGNOSIS — L905 Scar conditions and fibrosis of skin: Secondary | ICD-10-CM

## 2021-03-25 DIAGNOSIS — L738 Other specified follicular disorders: Secondary | ICD-10-CM | POA: Diagnosis not present

## 2021-03-25 DIAGNOSIS — L918 Other hypertrophic disorders of the skin: Secondary | ICD-10-CM

## 2021-04-08 ENCOUNTER — Other Ambulatory Visit: Payer: Self-pay | Admitting: Sports Medicine

## 2021-04-10 ENCOUNTER — Other Ambulatory Visit: Payer: Self-pay | Admitting: Sports Medicine

## 2021-04-13 ENCOUNTER — Encounter: Payer: Self-pay | Admitting: Dermatology

## 2021-04-13 NOTE — Progress Notes (Signed)
° °  Follow-Up Visit   Subjective  Brent Huynh is a 65 y.o. male who presents for the following: Annual Exam (Patient is here for full body skin exam, patient has history of Basal Cell and Atypical mole. Advised patient about skin tags 1-15 230$ cosmetic ).  Annual skin examination, several areas to check Location:  Duration:  Quality:  Associated Signs/Symptoms: Modifying Factors:  Severity:  Timing: Context:   Objective  Well appearing patient in no apparent distress; mood and affect are within normal limits. Mid Back Full body skin exam.  No atypical pigmented lesions, new or recurrent nonmelanoma skin cancer.  Left Upper Back Noninflamed 8 mm deep dermal papule  Left Forehead, Left Lower Leg - Posterior, Right Forehead, Right Lower Leg - Posterior, Torso - Posterior (Back) Multiple brown 4 to 6 mm flattopped textured brown papules, typical dermoscopy  Left Parotid Area 5 mm tan symmetric brown macule, no dermoscopic atypia  Chest (Upper Torso, Anterior), Right Breast Multiple 2 mm smooth red dermal papules  Right Knee - Anterior Slightly thickened scar  Right Axilla Multiple intertriginous skin tags    A full examination was performed including scalp, head, eyes, ears, nose, lips, neck, chest, axillae, abdomen, back, buttocks, bilateral upper extremities, bilateral lower extremities, hands, feet, fingers, toes, fingernails, and toenails. All findings within normal limits unless otherwise noted below.   Assessment & Plan    Screening for malignant neoplasm of skin Mid Back  Yearly skin exam.  Encouraged to self examine twice annually.  Epidermoid cyst Left Upper Back  Benign, ok to leave unless pt wants removed  Seborrheic keratosis (5) Left Lower Leg - Posterior; Right Lower Leg - Posterior; Torso - Posterior (Back); Left Forehead; Right Forehead  No intervention currently necessary  Sebaceous hyperplasia of face (2) Mid Forehead; Right  Forehead  Lentigo Left Parotid Area  Recheck as needed change   Cherry angioma (2) Chest (Upper Torso, Anterior); Right Breast  No intervention necessary  Scar conditions and fibrosis of skin Right Knee - Anterior  Recheck as needed change  Skin tag Right Axilla  Benign, ok to leave unless pt wants removed      I, Brent Monarch, MD, have reviewed all documentation for this visit.  The documentation on 04/13/21 for the exam, diagnosis, procedures, and orders are all accurate and complete.

## 2021-04-16 ENCOUNTER — Other Ambulatory Visit: Payer: Self-pay | Admitting: Sports Medicine

## 2021-05-31 ENCOUNTER — Other Ambulatory Visit: Payer: Self-pay | Admitting: Sports Medicine

## 2021-07-07 ENCOUNTER — Other Ambulatory Visit: Payer: Self-pay | Admitting: Sports Medicine

## 2021-07-10 DIAGNOSIS — F418 Other specified anxiety disorders: Secondary | ICD-10-CM | POA: Diagnosis not present

## 2021-07-10 DIAGNOSIS — Z0000 Encounter for general adult medical examination without abnormal findings: Secondary | ICD-10-CM | POA: Diagnosis not present

## 2021-07-10 DIAGNOSIS — Z125 Encounter for screening for malignant neoplasm of prostate: Secondary | ICD-10-CM | POA: Diagnosis not present

## 2021-07-10 DIAGNOSIS — E782 Mixed hyperlipidemia: Secondary | ICD-10-CM | POA: Diagnosis not present

## 2021-07-10 DIAGNOSIS — Z23 Encounter for immunization: Secondary | ICD-10-CM | POA: Diagnosis not present

## 2021-07-10 DIAGNOSIS — K219 Gastro-esophageal reflux disease without esophagitis: Secondary | ICD-10-CM | POA: Diagnosis not present

## 2021-07-10 DIAGNOSIS — E1142 Type 2 diabetes mellitus with diabetic polyneuropathy: Secondary | ICD-10-CM | POA: Diagnosis not present

## 2021-08-06 DIAGNOSIS — I10 Essential (primary) hypertension: Secondary | ICD-10-CM | POA: Diagnosis not present

## 2021-08-07 ENCOUNTER — Other Ambulatory Visit: Payer: Self-pay | Admitting: Sports Medicine

## 2021-09-17 ENCOUNTER — Other Ambulatory Visit: Payer: Self-pay | Admitting: Sports Medicine

## 2021-09-24 DIAGNOSIS — I10 Essential (primary) hypertension: Secondary | ICD-10-CM | POA: Diagnosis not present

## 2021-12-22 ENCOUNTER — Other Ambulatory Visit: Payer: Self-pay | Admitting: *Deleted

## 2021-12-22 MED ORDER — MELOXICAM 15 MG PO TABS: ORAL_TABLET | ORAL | 0 refills | Status: DC

## 2022-01-07 DIAGNOSIS — I10 Essential (primary) hypertension: Secondary | ICD-10-CM | POA: Diagnosis not present

## 2022-01-07 DIAGNOSIS — F418 Other specified anxiety disorders: Secondary | ICD-10-CM | POA: Diagnosis not present

## 2022-01-07 DIAGNOSIS — F3342 Major depressive disorder, recurrent, in full remission: Secondary | ICD-10-CM | POA: Diagnosis not present

## 2022-01-07 DIAGNOSIS — E1142 Type 2 diabetes mellitus with diabetic polyneuropathy: Secondary | ICD-10-CM | POA: Diagnosis not present

## 2022-02-10 ENCOUNTER — Other Ambulatory Visit: Payer: Self-pay | Admitting: Sports Medicine

## 2022-02-14 ENCOUNTER — Emergency Department (HOSPITAL_COMMUNITY)
Admission: EM | Admit: 2022-02-14 | Discharge: 2022-02-15 | Disposition: A | Discharge status: Home / Self Care | Payer: BC Managed Care – PPO | Attending: Emergency Medicine | Admitting: Emergency Medicine

## 2022-02-14 ENCOUNTER — Emergency Department (HOSPITAL_COMMUNITY): Payer: BC Managed Care – PPO

## 2022-02-14 ENCOUNTER — Other Ambulatory Visit: Payer: Self-pay

## 2022-02-14 ENCOUNTER — Encounter (HOSPITAL_COMMUNITY): Payer: Self-pay

## 2022-02-14 DIAGNOSIS — Z7982 Long term (current) use of aspirin: Secondary | ICD-10-CM | POA: Diagnosis not present

## 2022-02-14 DIAGNOSIS — R079 Chest pain, unspecified: Secondary | ICD-10-CM | POA: Diagnosis not present

## 2022-02-14 DIAGNOSIS — M546 Pain in thoracic spine: Secondary | ICD-10-CM | POA: Diagnosis not present

## 2022-02-14 DIAGNOSIS — M25511 Pain in right shoulder: Secondary | ICD-10-CM | POA: Diagnosis not present

## 2022-02-14 DIAGNOSIS — I10 Essential (primary) hypertension: Secondary | ICD-10-CM | POA: Insufficient documentation

## 2022-02-14 DIAGNOSIS — M549 Dorsalgia, unspecified: Secondary | ICD-10-CM

## 2022-02-14 HISTORY — DX: Essential (primary) hypertension: I10

## 2022-02-14 LAB — CBC
HCT: 42.7 % (ref 39.0–52.0)
Hemoglobin: 15.3 g/dL (ref 13.0–17.0)
MCH: 31.5 pg (ref 26.0–34.0)
MCHC: 35.8 g/dL (ref 30.0–36.0)
MCV: 88 fL (ref 80.0–100.0)
Platelets: 183 10*3/uL (ref 150–400)
RBC: 4.85 MIL/uL (ref 4.22–5.81)
RDW: 13.5 % (ref 11.5–15.5)
WBC: 9.6 10*3/uL (ref 4.0–10.5)
nRBC: 0 % (ref 0.0–0.2)

## 2022-02-14 LAB — COMPREHENSIVE METABOLIC PANEL
ALT: 21 U/L (ref 0–44)
AST: 17 U/L (ref 15–41)
Albumin: 4.1 g/dL (ref 3.5–5.0)
Alkaline Phosphatase: 55 U/L (ref 38–126)
Anion gap: 6 (ref 5–15)
BUN: 18 mg/dL (ref 8–23)
CO2: 27 mmol/L (ref 22–32)
Calcium: 8.7 mg/dL — ABNORMAL LOW (ref 8.9–10.3)
Chloride: 107 mmol/L (ref 98–111)
Creatinine, Ser: 0.77 mg/dL (ref 0.61–1.24)
GFR, Estimated: >60 mL/min (ref >60)
Glucose, Bld: 117 mg/dL — ABNORMAL HIGH (ref 70–99)
Potassium: 4 mmol/L (ref 3.5–5.1)
Sodium: 140 mmol/L (ref 135–145)
Total Bilirubin: 0.8 mg/dL (ref 0.3–1.2)
Total Protein: 7.1 g/dL (ref 6.5–8.1)

## 2022-02-14 LAB — TROPONIN I (HIGH SENSITIVITY)
Troponin I (High Sensitivity): 4 ng/L (ref <18)
Troponin I (High Sensitivity): 4 ng/L (ref <18)

## 2022-02-14 NOTE — ED Triage Notes (Signed)
Patient said he was out walking in the woods and his right shoulder began hurting. He got home and checked his BP and it was 604 systolic.

## 2022-02-14 NOTE — ED Provider Triage Note (Signed)
Emergency Medicine Provider Triage Evaluation Note  Brent Huynh , a 66 y.o. male  was evaluated in triage.  Pt complains of posterior right shoulder pain that is he is walking his dogs in the woods today around 1:00 PM.  He says at the time it radiated to his mid back. He checked his blood pressure and states that it was high for him with systolic I the 381O. He states that he is still having symptoms in his right shoulder. He does report being on blood pressure medication and has been taking this as prescribed.  He denies any shortness of breath, abdominal pain, nausea, vomiting, syncope, or dizziness.  Review of Systems  Positive:  Negative:   Physical Exam  Ht '6\' 3"'$  (1.905 m)   Wt 120 kg   BMI 33.07 kg/m  Gen:   Awake, no distress   Resp:  Normal effort  MSK:   Moves extremities without difficulty  Other:  Abdomen is soft, nontender, negative murphy sign. No respiratory distress. Lungs CTA. Heart sounds normal. Radial pulses equal  bilaterally. No LE edema.   Medical Decision Making  Medically screening exam initiated at 3:42 PM.  Appropriate orders placed.  Robie Mcniel was informed that the remainder of the evaluation will be completed by another provider, this initial triage assessment does not replace that evaluation, and the importance of remaining in the ED until their evaluation is complete.     Adolphus Birchwood, [place] 02/14/22 1545

## 2022-02-14 NOTE — ED Provider Notes (Signed)
[facility]-EMERGENCY DEPT Provider Note   CSN: [id number] Arrival date & time: 02/14/22  1534     History  Chief Complaint  Patient presents with   Hypertension    Brent Huynh is a 66 y.o. male.  66 y/o male with hx of HTN presents to the ED for evaluation of R shoulder pain. Symptoms began at 1300 while patient was walking his dogs in the woods. Describes the pain as an aching sensation. It has been constant and is spontaneously improving. No medications PTA. Pain not improved with rest, not aggravated by activity. He denies significant worsening with movement of the RUE. Took his BP at home which was high in the 170's. He has been compliant with his medications. Denies jaw pain, chest pain, SOB, diaphoresis, abdominal pain, nausea, vomiting, extremity numbness or paresthesias, extremity weakness, syncope/near syncope. Patient denies hx of ACS.  The history is provided by the patient. No language interpreter was used.  Hypertension       Home Medications Prior to Admission medications   Medication Sig Start Date End Date Taking? Authorizing Provider  ALPRAZolam Prudy Feeler) 0.5 MG tablet Take 0.25-0.5 mg by mouth 2 (two) times daily as needed for anxiety.  06/30/13   [provider]  aspirin EC 81 MG tablet Take 81 mg by mouth daily.    [provider]  atorvastatin (LIPITOR) 10 MG tablet Take 10 mg by mouth daily.  08/29/13   [provider]  buPROPion (WELLBUTRIN XL) 300 MG 24 hr tablet Take 300 mg by mouth daily.  08/29/13   [provider]  fexofenadine (ALLEGRA) 180 MG tablet Take 180 mg by mouth daily as needed for allergies or rhinitis.    [provider]  ibuprofen (ADVIL,MOTRIN) 200 MG tablet Take 400 mg by mouth daily as needed for headache or moderate pain.    [provider]  meloxicam (MOBIC) 15 MG tablet Take one tab by mouth daily with food as needed 12/22/21   Margaretha Sheffield, Ozzie Hoyle, DO  omeprazole  (PRILOSEC OTC) 20 MG tablet Take 20 mg by mouth daily.    [provider]  sildenafil (REVATIO) 20 MG tablet Take 60 mg by mouth as needed (ED).    [provider]  zolpidem (AMBIEN) 5 MG tablet Take 10 mg by mouth at bedtime.  09/13/13   [provider]      Allergies    Lactose intolerance (gi) and Pepto-bismol [bismuth subsalicylate]    Review of Systems   Review of Systems Ten systems reviewed and are negative for acute change, except as noted in the HPI.    Physical Exam Updated Vital Signs BP 130/80   Pulse (!) 52   Temp 97.9 F (36.6 C) (Oral)   Resp 20   Ht 6\' 3"  (1.905 m)   Wt 120 kg   SpO2 98%   BMI 33.07 kg/m   Physical Exam Vitals and nursing note reviewed.  Constitutional:      General: He is not in acute distress.    Appearance: He is well-developed. He is not diaphoretic.     Comments: Nontoxic appearing and in NAD  HENT:     Head: Normocephalic and atraumatic.  Eyes:     General: No scleral icterus.    Conjunctiva/sclera: Conjunctivae normal.  Cardiovascular:     Rate and Rhythm: Normal rate and regular rhythm.     Pulses: Normal pulses.  Pulmonary:     Effort: Pulmonary effort is normal. No  respiratory distress.     Comments: Respirations even and unlabored Abdominal:     General: There is no distension.     Palpations: Abdomen is soft. There is no mass.     Tenderness: There is no abdominal tenderness. There is no guarding.     Hernia: No hernia is present.     Comments: Soft, nondistended, nontender abdomen  Musculoskeletal:        General: Normal range of motion.     Cervical back: Normal range of motion.     Right lower leg: No edema.     Left lower leg: No edema.  Skin:    General: Skin is warm and dry.     Coloration: Skin is not pale.     Findings: No erythema or rash.  Neurological:     Mental Status: He is alert and oriented to person, place, and time.     Coordination: Coordination normal.  Psychiatric:         Behavior: Behavior normal.     ED Results / Procedures / Treatments   Labs (all labs ordered are listed, but only abnormal results are displayed) Labs Reviewed  COMPREHENSIVE METABOLIC PANEL - Abnormal; Notable for the following components:      Result Value   Glucose, Bld 117 (*)    Calcium 8.7 (*)    All other components within normal limits  CBC  TROPONIN I (HIGH SENSITIVITY)  TROPONIN I (HIGH SENSITIVITY)    EKG EKG Interpretation  Date/Time:  Saturday February 14 2022 15:41:47 EDT Ventricular Rate:  63 PR Interval:  169 QRS Duration: 108 QT Interval:  464 QTC Calculation: 475 R Axis:   -61 Text Interpretation: Sinus rhythm Left anterior fascicular block Abnormal R-wave progression, late transition Baseline wander in lead(s) I aVL Confirmed by Geoffery Lyons ([id number]) on 02/15/2022 12:58:36 AM  Radiology DG Chest Port 1 View  Result Date: 02/14/2022 CLINICAL DATA:  Chest pain EXAM: PORTABLE CHEST 1 VIEW COMPARISON:  None Available. FINDINGS: Cardiac size is within normal limits. There are no signs of pulmonary edema or focal pulmonary consolidation. Crowding of markings in the lower lung fields may be due to poor inspiration. There is no pleural effusion or pneumothorax. IMPRESSION: No active disease. Electronically Signed   By: Ernie Avena M.D.   On: 02/14/2022 15:53    Procedures Procedures    Medications Ordered in ED Medications - No data to display  ED Course/ Medical Decision Making/ A&P                           Medical Decision Making  This patient presents to the ED for concern of posterior R shoulder pain, this involves an extensive number of treatment options, and is a complaint that carries with it a high risk of complications and morbidity.  The differential diagnosis includes PNA vs PTX vs MSK vs PE vs referred pain from biliary pathology   Co morbidities that complicate the patient evaluation  HTN   Additional history  obtained:  External records from outside source obtained and reviewed including prior sports medicine visits for knee and hip pain   Lab Tests:  I Ordered, and personally interpreted labs.  The pertinent results include:  Normal CBC, CMP. Negative troponin x 2.   Imaging Studies ordered:  I ordered imaging studies including CXR  I independently visualized and interpreted imaging which showed no acute cardiopulmonary abnormality; no PTX, PNA, pleural effusion, cardiomegaly. No  mediastinal widening to suggest dissection. I agree with the radiologist interpretation   Cardiac Monitoring:  The patient was maintained on a cardiac monitor.  I personally viewed and interpreted the cardiac monitored which showed an underlying rhythm of: sinus rhythm   Medicines ordered and prescription drug management:  I have reviewed the patients home medicines and have made adjustments as needed   Test Considered:  CT PE study - however, patient's symptoms atypical for PE. Well's PE score is 0 c/w low risk.   Problem List / ED Course:  Patient with posterior R shoulder pain which is spontaneously improving. Associated with HTN at home which has been down trending over ED course.  MSK considered, but difficulty to reproduce pain on exam. Low suspicion for emergent cardiac etiology given reassuring workup today.  Symptoms atypical for ACS. EKG is nonischemic and troponin negative x2.   Chest x-ray without evidence of mediastinal widening to suggest dissection.  No pneumothorax, pneumonia, pleural effusion.   Pulmonary embolus further considered; however, patient without tachycardia, tachypnea, dyspnea, hypoxia. Low risk per Well's PE score. No leukocytosis or LFT changes to suggest referred pain from biliary pathology. No abdominal TTP on exam.   Reevaluation:  After the interventions noted above, I reevaluated the patient and found that they have :improved   Social Determinants of  Health:  Insured patient   Dispostion:  After consideration of the diagnostic results and the patients response to treatment, I feel that the patent would benefit from outpatient PCP follow up for repeat evaluation, BP recheck. Return precautions discussed and provided. Patient discharged in stable condition with no unaddressed concerns.          Final Clinical Impression(s) / ED Diagnoses Final diagnoses:  Upper back pain on right side  Hypertension not at goal    Rx / DC Orders ED Discharge Orders     None         Antony Madura, PA-C 02/15/22 0059    Geoffery Lyons, MD 02/15/22 [phone]

## 2022-02-15 NOTE — Discharge Instructions (Addendum)
Your work-up in the ED today was reassuring.  Your blood pressure is improving.  We recommend that you have your blood pressure rechecked by your primary care doctor within the week.  Your primary care doctor should also reassess your pain if it persists.  Return to the ED for new or concerning symptoms.

## 2022-03-03 DIAGNOSIS — H6123 Impacted cerumen, bilateral: Secondary | ICD-10-CM | POA: Diagnosis not present

## 2022-03-05 ENCOUNTER — Ambulatory Visit (INDEPENDENT_AMBULATORY_CARE_PROVIDER_SITE_OTHER): Payer: BC Managed Care – PPO | Admitting: Sports Medicine

## 2022-03-05 ENCOUNTER — Ambulatory Visit
Admission: RE | Admit: 2022-03-05 | Discharge: 2022-03-05 | Disposition: A | Discharge status: Home / Self Care | Payer: BC Managed Care – PPO | Source: Ambulatory Visit | Attending: Sports Medicine | Admitting: Sports Medicine

## 2022-03-05 VITALS — BP 136/74 | Ht 75.0 in | Wt 262.0 lb

## 2022-03-05 DIAGNOSIS — M25561 Pain in right knee: Secondary | ICD-10-CM

## 2022-03-05 MED ORDER — MELOXICAM 15 MG PO TABS: ORAL_TABLET | ORAL | 0 refills | Status: DC

## 2022-03-05 NOTE — Progress Notes (Signed)
  SUBJECTIVE:   CHIEF COMPLAINT / HPI:   Right knee pain: 66 year old established patient presenting for recent onset of right knee pain which began 3 to 4 months ago.  Notes that it has felt swollen and stiff and moving from a sitting to standing position requires adjustment time.  He notes the pain is anterior and posterior on the left knee and sometimes extends up the thigh and down his shin.  Sometimes it feels unstable and he is careful when walking on nonlevel surfaces.  He feels sharp pain sometimes when walking but generally it is a dull ache even when he is not having any pressure on his leg.  Denies recent trauma.  He has been using a knee sleeve and extra strength Advil which have helped.  His only form of exercise currently is walking his dog.  PERTINENT  PMH / PSH: Reviewed  OBJECTIVE:  BP 136/74   Ht '6\' 3"'$  (1.905 m)   Wt 262 lb (118.8 kg)   BMI 32.75 kg/m  Right knee: No gross deformity, ecchymoses, swelling.  Appreciable crepitus. No TTP. FROM with normal strength. Negative ant/post drawers. Negative valgus/varus testing. Negative lachman. Negative mcmurrays. NV intact distally.   ASSESSMENT/PLAN:  Acute pain of right knee Assessment & Plan: Unremarkable physical exam with the exception of crepitus in combination with history is more consistent with osteoarthritis.  Do not suspect meniscal or ligamentous pathology at this time.  Degenerative changes are noted in x-ray of opposite knee 1 year prior.  Will obtain x-ray for further evaluation in addition to trial of Mobic x7 days and quad/hip exercises.  Follow-up in 4 weeks.  May consider steroid injection for OA at that time.  Orders: -     DG Knee AP/LAT W/Sunrise Right; Future  Other orders -     Meloxicam; Take one tab daily for 7 days with food. Then take as needed.  Dispense: 40 tablet; Refill: 0  Wells Guiles, DO 03/05/2022, 11:18 AM PGY-2, [place]  Patient seen and evaluated with the  resident.  I agree with the above plan of care.  Patient's symptoms are suggestive of patellofemoral osteoarthritis.  We will get an x-ray to evaluate further.  I recommended a 7-day trial of meloxicam coupled with continued use of his compression sleeve and a home exercise program consisting of isometric quad and hip abductor strengthening exercises.  Follow-up with me again in 4 weeks.  We will review his x-rays together at that time.  Future considerations could include further diagnostic imaging or cortisone injection if symptoms persist.  This note was dictated using Dragon naturally speaking software and may contain errors in syntax, spelling, or content which have not been identified prior to signing this note.

## 2022-03-05 NOTE — Assessment & Plan Note (Addendum)
Unremarkable physical exam with the exception of crepitus in combination with history is more consistent with osteoarthritis.  Do not suspect meniscal or ligamentous pathology at this time.  Degenerative changes are noted in x-ray of opposite knee 1 year prior.  Will obtain x-ray for further evaluation in addition to trial of Mobic x7 days and quad/hip exercises.  Follow-up in 4 weeks.  May consider steroid injection for OA at that time.

## 2022-03-16 ENCOUNTER — Other Ambulatory Visit: Payer: Self-pay | Admitting: Sports Medicine

## 2022-03-31 ENCOUNTER — Ambulatory Visit (INDEPENDENT_AMBULATORY_CARE_PROVIDER_SITE_OTHER): Payer: BC Managed Care – PPO | Admitting: Sports Medicine

## 2022-03-31 VITALS — BP 136/80 | Ht 75.25 in | Wt 265.0 lb

## 2022-03-31 DIAGNOSIS — M1711 Unilateral primary osteoarthritis, right knee: Secondary | ICD-10-CM

## 2022-04-01 ENCOUNTER — Ambulatory Visit: Payer: BC Managed Care – PPO | Admitting: Dermatology

## 2022-04-01 NOTE — Progress Notes (Signed)
Patient ID: Brent Huynh, male   DOB: 17-Oct-1955, 66 y.o.   MRN: [id number]  Brent Huynh presents today for follow-up on right knee pain.  He is doing much better.  He found the meloxicam to be helpful.  He has been diligent with his home exercises.  He does note that his knee gets sore after walking if he does not take his meloxicam.  X-rays of his right knee show moderate degenerative changes in the lateral compartment and mild degenerative changes in the patellofemoral compartment.  Physical exam was not repeated today.  We simply talked about treatment going forward.  I recommended that he try a dose of meloxicam prophylactically a few hours before his recreational walks.  He also understands that he can repeat daily meloxicam for up to 5 to 7 days as needed as long as he does not experience any GI upset.  Given his overall improvement I would not recommend any further workup or treatment at this time.  He will follow-up with me as needed.  This note was dictated using Dragon naturally speaking software and may contain errors in syntax, spelling, or content which have not been identified prior to signing this note.

## 2022-04-08 ENCOUNTER — Other Ambulatory Visit: Payer: Self-pay

## 2022-04-08 MED ORDER — MELOXICAM 15 MG PO TABS: ORAL_TABLET | ORAL | 1 refills | Status: AC

## 2022-04-27 ENCOUNTER — Other Ambulatory Visit: Payer: Self-pay

## 2022-04-27 ENCOUNTER — Ambulatory Visit
Admission: RE | Admit: 2022-04-27 | Discharge: 2022-04-27 | Disposition: A | Discharge status: Home / Self Care | Payer: BC Managed Care – PPO | Source: Ambulatory Visit | Attending: Family Medicine | Admitting: Family Medicine

## 2022-04-27 DIAGNOSIS — M25571 Pain in right ankle and joints of right foot: Secondary | ICD-10-CM

## 2022-04-27 NOTE — Progress Notes (Signed)
Pt called requesting appt for right ankle pain s/p fall. Asking for x-ray prior to visit on 05/01/22. Order placed.

## 2022-05-01 ENCOUNTER — Ambulatory Visit (INDEPENDENT_AMBULATORY_CARE_PROVIDER_SITE_OTHER): Payer: BC Managed Care – PPO | Admitting: Family Medicine

## 2022-05-01 VITALS — BP 148/80 | Ht 75.25 in | Wt 260.0 lb

## 2022-05-01 DIAGNOSIS — M25571 Pain in right ankle and joints of right foot: Secondary | ICD-10-CM

## 2022-05-01 NOTE — Patient Instructions (Signed)
You strained your posterior tibialis tendon. Icing 15 minutes at a time 3-4 times a day. Elevate above your heart when possible. Supportive shoes are important when up and walking around. Consider boot if you're struggling - just call us. Meloxicam '15mg'$  daily with food for pain and inflammation - don't take aleve or ibuprofen with this. Ok to take tylenol in addition to meloxicam though. Motion exercises of the ankle to help get the swelling out and work out stiffness. Follow up in 4 weeks.

## 2022-05-01 NOTE — Progress Notes (Incomplete)
   Subjective:   HPI: Patient is a 67 y.o. male here for Right ankle pain.  Patient reports that on January 2nd he was on vacation in [place] and he jumped down from an embankment that was about 55f high and tumbled down a bit. He noticed a crunching sound and pain but he was able to limp on the leg after the injury. He immediately put ice on the area and has been using OTC Advil (about 500-'1000mg'$  2-3 times daily) with some improvement. He has been ambulating with his cane. He felt that it was getting better until the last day or so, he was on his feet more in the last few days but has no other acute injury. He does report history of multiple ankle injuries during his lifetime and that he has never done any rehabilitation after those injuries.   PMH, medications, surgical history, social history, family history and allergies reviewed.     Objective:  Physical Exam: BP (!) 148/80   Ht 6' 3.25" (1.911 m)   Wt 260 lb (117.9 kg)   BMI 32.28 kg/m  Gen: awake, alert, NAD, comfortable in exam room Pulm: breathing unlabored  Right Ankle: - Inspection: Mild swelling of the medial foot distal to the medial malleolus. No ecchymosis, ulcers, or blisters - Palpation: some pain with palpation of the posterior tibial tendon and inferior to the medial malleolus,  non-TTP at the base of the 5th metatarsal, non-tender at the cuboid or navicular region. - Strength: Normal strength with dorsiflexion, plantarflexion, inversion, and eversion of foot; flexion and extension of toes - ROM: Full ROM though some pain with inversion and eversion - Neuro/vasc: NV intact - Special Tests: Negative anterior drawer.  Negative syndesmotic compression. Pain with resisted eversion but no obvious laxity   Feet: no deformity noted.  Normal arch w/I pes cavus or planus, normal arch flexibility.  Normal calcaneal motion with toe-raise.  Right ankle [place] visualized the posterior tibial tendon which demonstrated hyperechoic fluid  surrounding the tendon and adjacent tissues. Tendon was traced down to the insertion and fibers were visualized without defect.    DG Ankle Complete Right Result Date: 04/27/2022 FINDINGS: Moderate tibiotalar joint space narrowing and peripheral osteophytosis, greatest at the anterior joint. Triangular 9 mm well corticated ossicle distal to the medial malleolus, likely the sequela of remote trauma. Tiny plantar and mild posterior calcaneal heel spurs. Mild dorsal talonavicular degenerative osteophytes. No acute fracture is seen. No dislocation.  IMPRESSION: 1. Moderate tibiotalar osteoarthritis. 2. Tiny plantar and mild posterior calcaneal heel spurs.     Assessment & Plan:  1. Right Ankle Pain X-rays were reviewed with moderate tibiotalar OA and calcaneal and plantar heel spurs. Physical exam and history seem more consistent with a posterior tibial tendonitis.  - Continue Advil as needed (or can use Meloxicam if preferred but not both) for pain and inflammation - Tylenol PRN for pain - Ice and compression for swelling - Wear shoes with good arch support - Encouraged exercises such as tracing the alphabet with his foot - If ability to ambulate is not improving, patient can consider using walker boot for a few weeks   ARise Patience DO PGY-3, [place]

## 2022-06-01 ENCOUNTER — Ambulatory Visit: Payer: BC Managed Care – PPO | Admitting: Family Medicine

## 2022-06-03 ENCOUNTER — Other Ambulatory Visit: Payer: Self-pay | Admitting: Sports Medicine

## 2022-07-13 DIAGNOSIS — E1142 Type 2 diabetes mellitus with diabetic polyneuropathy: Secondary | ICD-10-CM | POA: Diagnosis not present

## 2022-07-13 DIAGNOSIS — Z79899 Other long term (current) drug therapy: Secondary | ICD-10-CM | POA: Diagnosis not present

## 2022-07-13 DIAGNOSIS — Z125 Encounter for screening for malignant neoplasm of prostate: Secondary | ICD-10-CM | POA: Diagnosis not present

## 2022-07-13 DIAGNOSIS — Z0000 Encounter for general adult medical examination without abnormal findings: Secondary | ICD-10-CM | POA: Diagnosis not present

## 2022-07-13 DIAGNOSIS — E782 Mixed hyperlipidemia: Secondary | ICD-10-CM | POA: Diagnosis not present

## 2022-09-02 ENCOUNTER — Ambulatory Visit (INDEPENDENT_AMBULATORY_CARE_PROVIDER_SITE_OTHER): Payer: BC Managed Care – PPO | Admitting: Sports Medicine

## 2022-09-02 VITALS — BP 138/84 | Ht 75.25 in | Wt 262.0 lb

## 2022-09-02 DIAGNOSIS — M545 Low back pain, unspecified: Secondary | ICD-10-CM | POA: Diagnosis not present

## 2022-09-02 MED ORDER — MELOXICAM 15 MG PO TABS: 15.0000 mg | ORAL_TABLET | Freq: Every day | ORAL | 1 refills | Status: AC

## 2022-09-02 MED ORDER — METHOCARBAMOL 500 MG PO TABS: 500.0000 mg | ORAL_TABLET | Freq: Three times a day (TID) | ORAL | 0 refills | Status: AC | PRN

## 2022-09-02 NOTE — Progress Notes (Signed)
Established Patient Office Visit  Subjective   Patient ID: Brent Huynh, male    DOB: 1956/02/25  Age: 67 y.o. MRN: [id number]  Bilateral low back pain.  Brent Huynh is here today with chief complaint of bilateral low back pain that began on Sunday.  He was out in [place] for his daughter's graduation sleeping and is slightly uncomfortable bed when he noticed some back pain.  He then flew home which did increase his back pain a little.  His pain was steadily improving however he had a small setback earlier in the week so he made his appointment today.  He reports his pain is pretty constant about the level of a 6 but can get up to an 8.  This pain is worse when he tries to stand back up from bending over.  He denies any numbness or tingling, radiation of pain down his legs or loss of bowel or bladder control.  He has tried ibuprofen with only temporary relief.   ROS as listed above in HPI    Objective:     BP 138/84   Ht 6' 3.25" (1.911 m)   Wt 262 lb (118.8 kg)   BMI 32.53 kg/m   Physical Exam Vitals reviewed.  Constitutional:      General: He is not in acute distress.    Appearance: Normal appearance. He is obese. He is not ill-appearing, toxic-appearing or diaphoretic.  Pulmonary:     Effort: Pulmonary effort is normal.  Skin:    General: Skin is warm.  Neurological:     Mental Status: He is alert.     Gait: Gait normal.  Psychiatric:        Mood and Affect: Mood normal.        Behavior: Behavior normal.        Thought Content: Thought content normal.        Judgment: Judgment normal.   Left low back: Seated comfortably in exam room, quick to transition from seated to standing position.  No ecchymosis edema or rash.  No tenderness to palpation the midline lumbar spine or step-off appreciated.  Some tenderness to palpation of the paraspinals and quadratus lumborum bilaterally.  Full range of motion with flexion extension, and rotation.  Strength 5/5 resisted hip  flexion, resisted knee flexion and extension.  Sensation intact to light touch.  Normal gait.     Assessment & Plan:   Problem List Items Addressed This Visit       Other   Acute bilateral low back pain without sciatica - Primary    Based on his clinical and physical exam today I am most suspicious for musculoskeletal strain of the low back.  I have sent a refill of meloxicam to his pharmacy to use for the next 7 to 14 days as well as a muscle relaxer, Robaxin that he can use up to 3 times a day as needed for spasms.  Did caution him of use with Ambien in the evenings.  He verbalized understanding.  He was also given some gentle stretches and exercises to perform for the next 14 days.  Follow-up with [place] if pain worsens or fails to improve.  At that point he may need formal physical therapy versus further imaging.       Relevant Medications   meloxicam (MOBIC) 15 MG tablet   methocarbamol (ROBAXIN) 500 MG tablet    Return in about 4 weeks (around 09/30/2022), or if symptoms worsen or fail to improve.  Claudie Leach, DO  Addendum:  I was the preceptor for this visit and available for immediate consultation.  Norton Blizzard MD Marrianne Mood

## 2022-09-02 NOTE — Assessment & Plan Note (Signed)
Based on his clinical and physical exam today I am most suspicious for musculoskeletal strain of the low back.  I have sent a refill of meloxicam to his pharmacy to use for the next 7 to 14 days as well as a muscle relaxer, Robaxin that he can use up to 3 times a day as needed for spasms.  Did caution him of use with Ambien in the evenings.  He verbalized understanding.  He was also given some gentle stretches and exercises to perform for the next 14 days.  Follow-up with [place] if pain worsens or fails to improve.  At that point he may need formal physical therapy versus further imaging.

## 2022-09-08 DIAGNOSIS — Z713 Dietary counseling and surveillance: Secondary | ICD-10-CM | POA: Diagnosis not present

## 2022-11-05 DIAGNOSIS — Z86010 Personal history of colonic polyps: Secondary | ICD-10-CM | POA: Diagnosis not present

## 2022-11-05 DIAGNOSIS — D125 Benign neoplasm of sigmoid colon: Secondary | ICD-10-CM | POA: Diagnosis not present

## 2022-11-05 DIAGNOSIS — Z09 Encounter for follow-up examination after completed treatment for conditions other than malignant neoplasm: Secondary | ICD-10-CM | POA: Diagnosis not present

## 2022-11-05 DIAGNOSIS — K5730 Diverticulosis of large intestine without perforation or abscess without bleeding: Secondary | ICD-10-CM | POA: Diagnosis not present

## 2022-11-05 DIAGNOSIS — D123 Benign neoplasm of transverse colon: Secondary | ICD-10-CM | POA: Diagnosis not present

## 2022-11-06 ENCOUNTER — Other Ambulatory Visit: Payer: Self-pay | Admitting: Sports Medicine

## 2022-11-30 DIAGNOSIS — D225 Melanocytic nevi of trunk: Secondary | ICD-10-CM | POA: Diagnosis not present

## 2022-11-30 DIAGNOSIS — L728 Other follicular cysts of the skin and subcutaneous tissue: Secondary | ICD-10-CM | POA: Diagnosis not present

## 2022-11-30 DIAGNOSIS — L814 Other melanin hyperpigmentation: Secondary | ICD-10-CM | POA: Diagnosis not present

## 2022-11-30 DIAGNOSIS — L821 Other seborrheic keratosis: Secondary | ICD-10-CM | POA: Diagnosis not present

## 2023-02-05 DIAGNOSIS — M25551 Pain in right hip: Secondary | ICD-10-CM | POA: Diagnosis not present

## 2023-04-23 DIAGNOSIS — Z23 Encounter for immunization: Secondary | ICD-10-CM | POA: Diagnosis not present

## 2023-04-23 DIAGNOSIS — R399 Unspecified symptoms and signs involving the genitourinary system: Secondary | ICD-10-CM | POA: Diagnosis not present

## 2023-05-08 DIAGNOSIS — F32A Depression, unspecified: Secondary | ICD-10-CM | POA: Diagnosis not present

## 2023-05-08 DIAGNOSIS — I10 Essential (primary) hypertension: Secondary | ICD-10-CM | POA: Diagnosis not present

## 2023-05-08 DIAGNOSIS — K8020 Calculus of gallbladder without cholecystitis without obstruction: Secondary | ICD-10-CM | POA: Diagnosis not present

## 2023-05-08 DIAGNOSIS — W010XXA Fall on same level from slipping, tripping and stumbling without subsequent striking against object, initial encounter: Secondary | ICD-10-CM | POA: Diagnosis not present

## 2023-05-08 DIAGNOSIS — S20211A Contusion of right front wall of thorax, initial encounter: Secondary | ICD-10-CM | POA: Diagnosis not present

## 2023-05-08 DIAGNOSIS — R0781 Pleurodynia: Secondary | ICD-10-CM | POA: Diagnosis not present

## 2023-05-08 DIAGNOSIS — E7800 Pure hypercholesterolemia, unspecified: Secondary | ICD-10-CM | POA: Diagnosis not present

## 2023-05-08 DIAGNOSIS — J939 Pneumothorax, unspecified: Secondary | ICD-10-CM | POA: Diagnosis not present

## 2023-05-08 DIAGNOSIS — Y92830 Public park as the place of occurrence of the external cause: Secondary | ICD-10-CM | POA: Diagnosis not present

## 2023-05-08 DIAGNOSIS — S20219A Contusion of unspecified front wall of thorax, initial encounter: Secondary | ICD-10-CM | POA: Diagnosis not present

## 2023-05-08 DIAGNOSIS — R918 Other nonspecific abnormal finding of lung field: Secondary | ICD-10-CM | POA: Diagnosis not present

## 2023-05-09 DIAGNOSIS — R0789 Other chest pain: Secondary | ICD-10-CM | POA: Diagnosis not present

## 2023-05-20 DIAGNOSIS — R0781 Pleurodynia: Secondary | ICD-10-CM | POA: Diagnosis not present

## 2023-05-25 DIAGNOSIS — R0781 Pleurodynia: Secondary | ICD-10-CM | POA: Diagnosis not present

## 2023-05-28 IMAGING — DX DG KNEE AP/LAT W/ SUNRISE*L*
3 series · 3 of 3 positions shown · non-contrast
Comparison: 02/16/2020.

CLINICAL DATA: Osteoarthritis left knee.

EXAM:
LEFT KNEE 3 VIEWS

[dg knee ap/lat w/ sunrise left (1 of 3)]
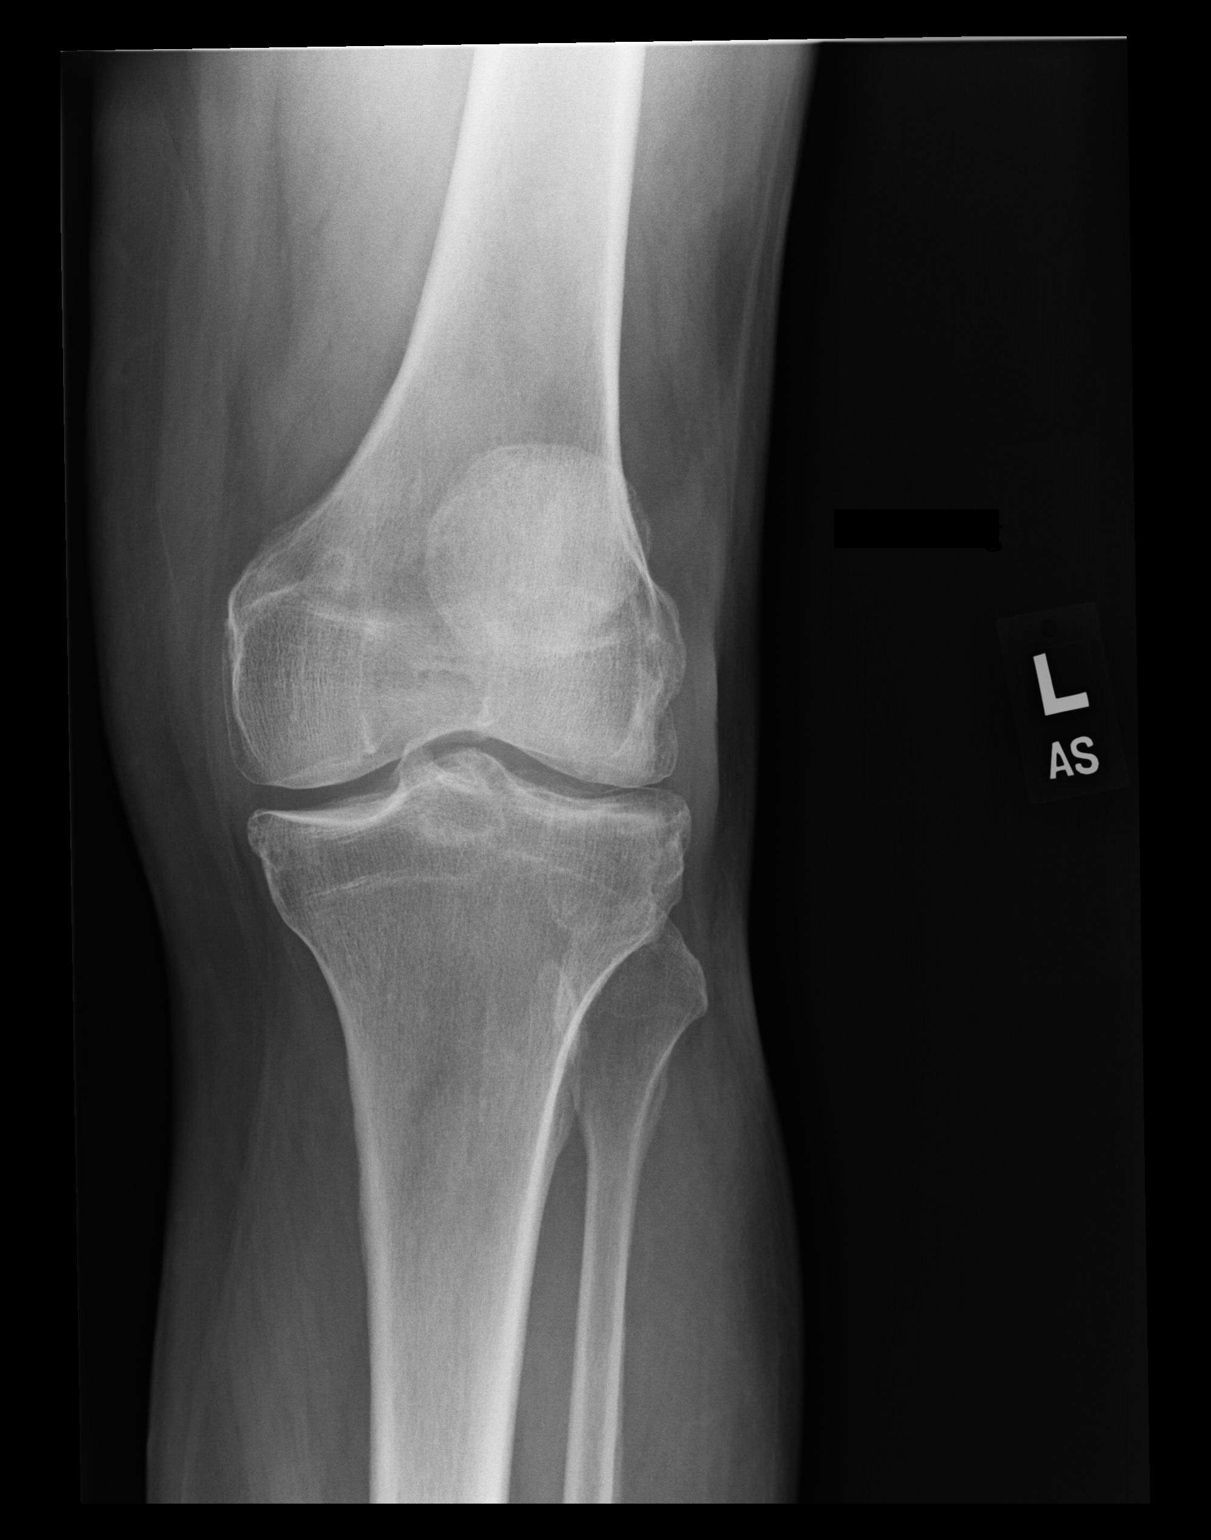

[dg knee ap/lat w/ sunrise left (2 of 3)]
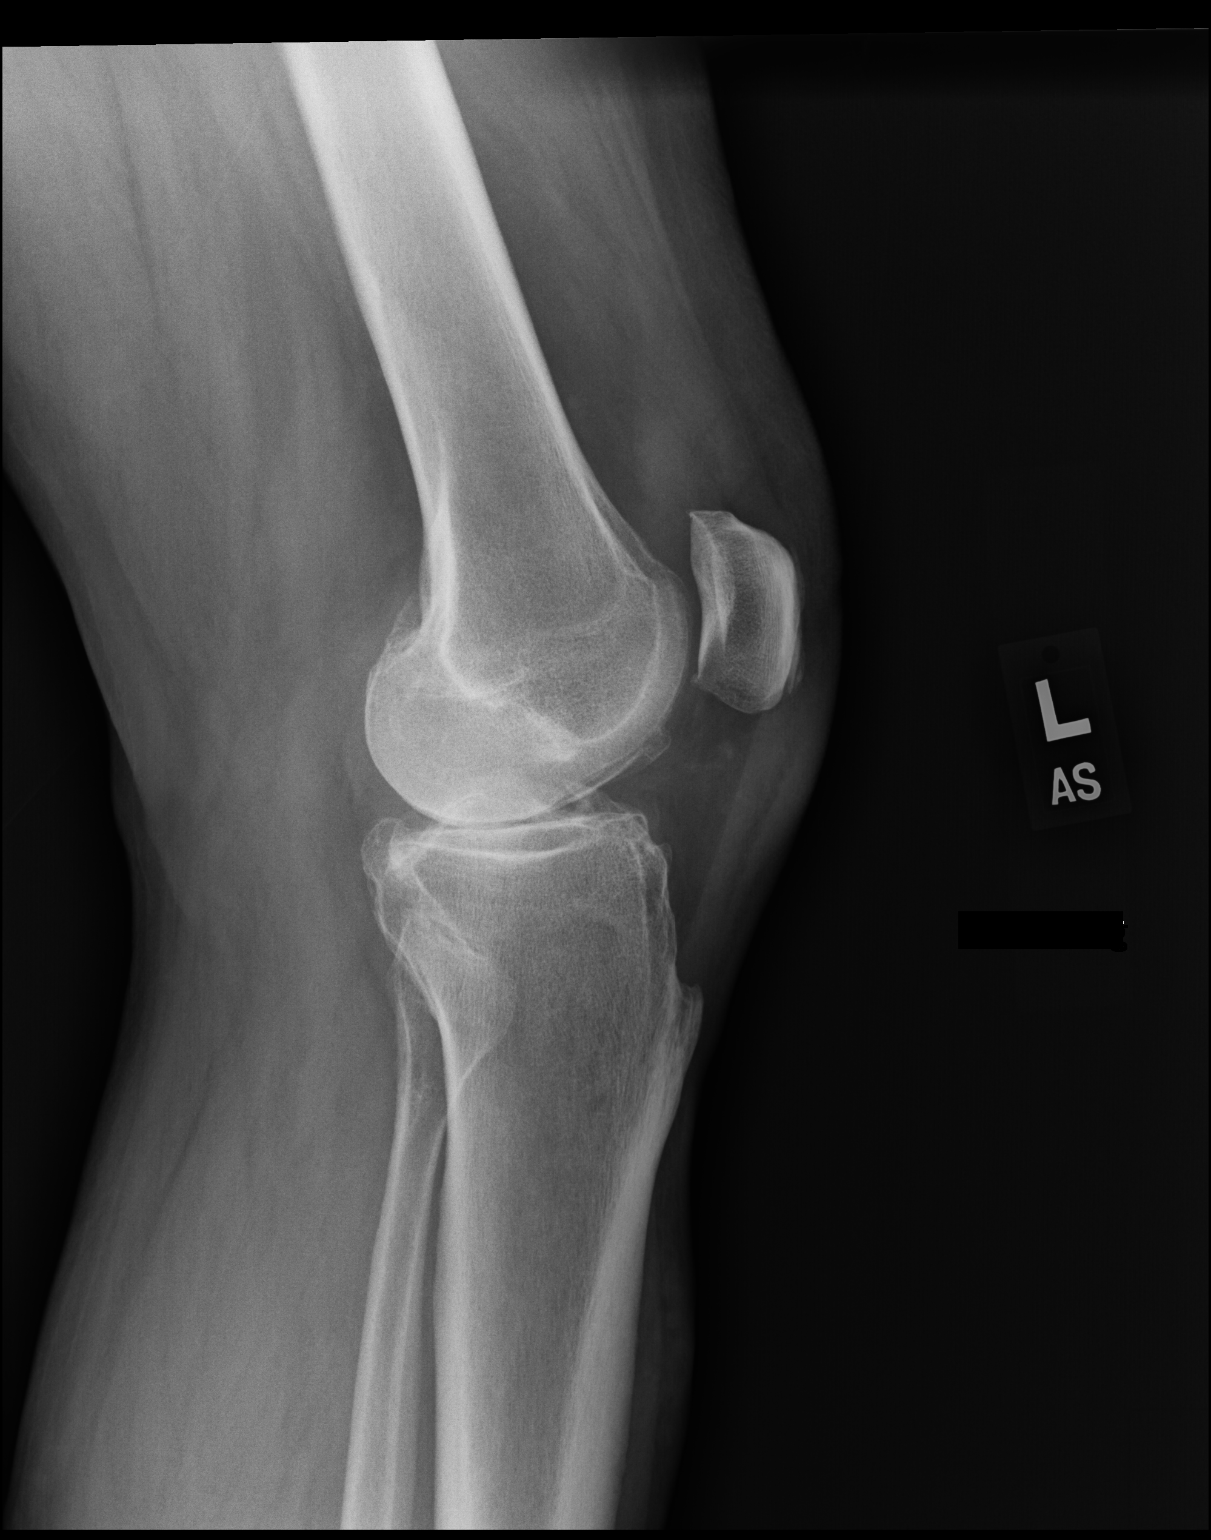

[dg knee ap/lat w/ sunrise left (3 of 3)]
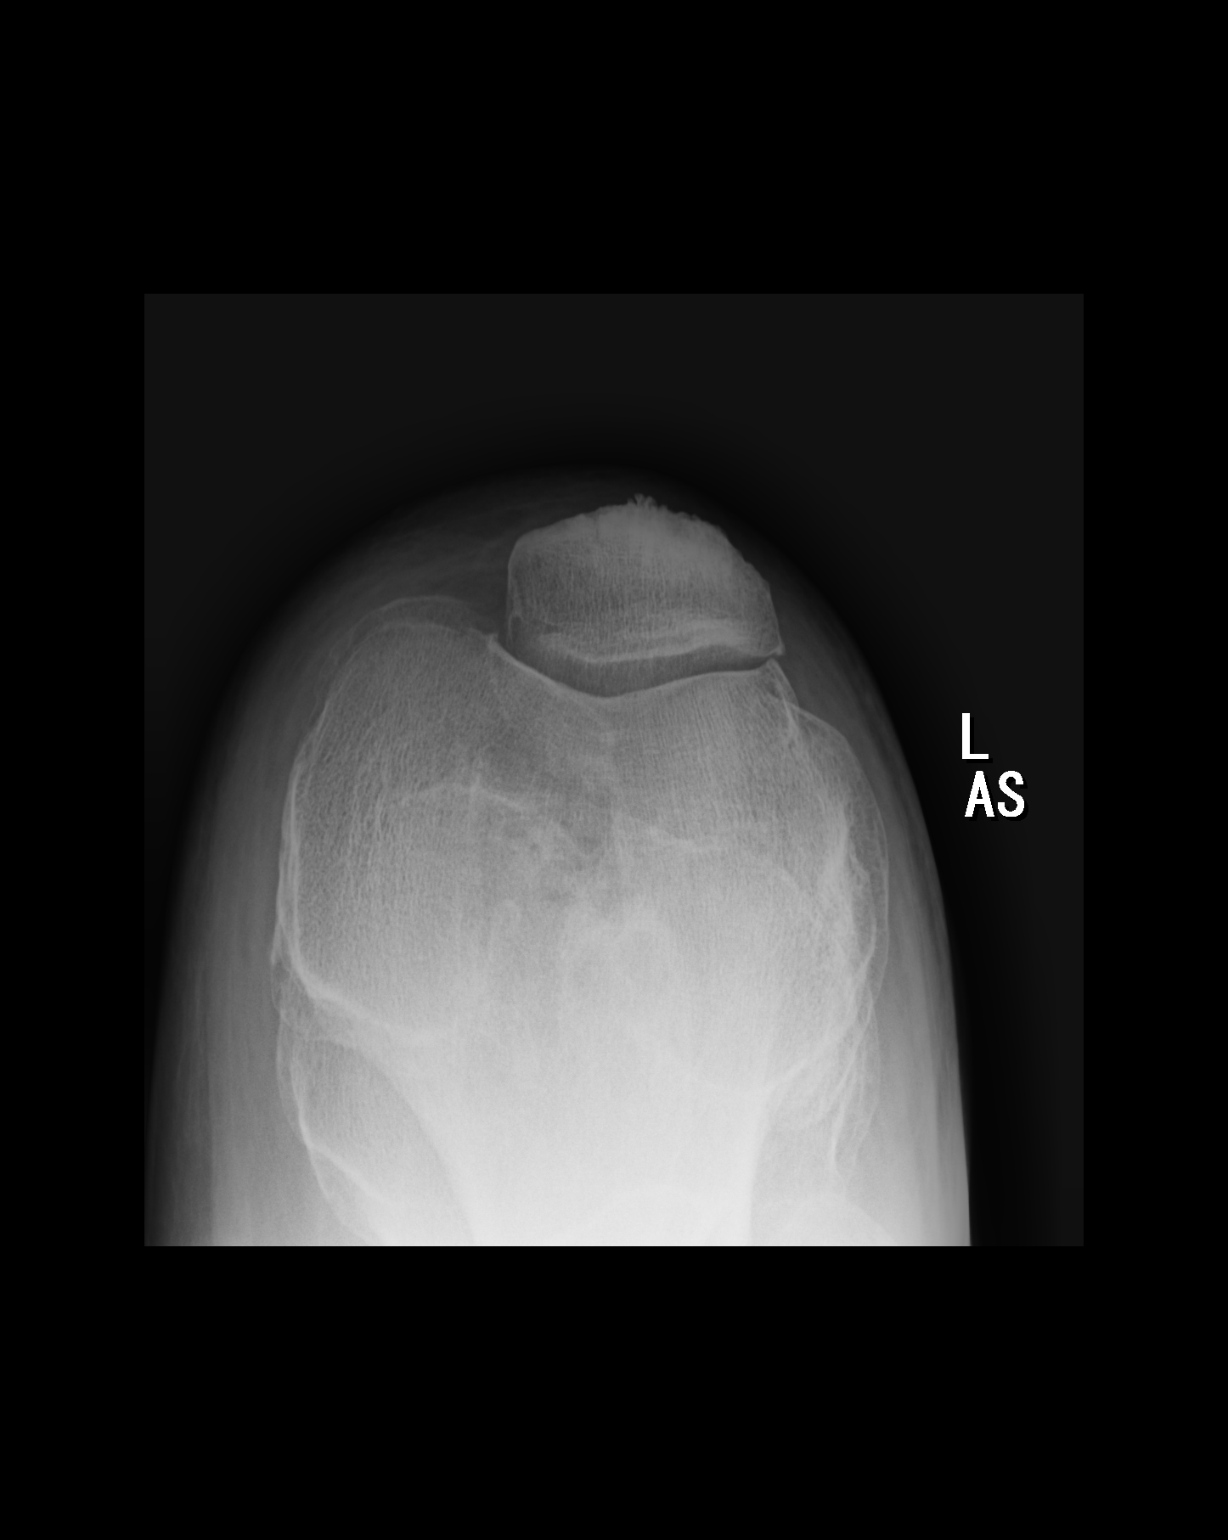

[3 of 3 positions shown; findings below may reference images not displayed]

FINDINGS: Tricompartment degenerative changes noted about the left knee.
Similar findings noted on prior exam. Questionable stable appearing
tiny corticated cyst noted along the medial aspect distal left
femur. No acute bony abnormality identified. No evidence of fracture
or dislocation. Tiny knee joint effusion.
IMPRESSION: Tricompartment degenerative changes left knee. Similar findings
noted on prior exam. No acute bony abnormality. Tiny knee joint
effusion.

## 2023-07-14 DIAGNOSIS — E1142 Type 2 diabetes mellitus with diabetic polyneuropathy: Secondary | ICD-10-CM | POA: Diagnosis not present

## 2023-07-14 DIAGNOSIS — N401 Enlarged prostate with lower urinary tract symptoms: Secondary | ICD-10-CM | POA: Diagnosis not present

## 2023-07-14 DIAGNOSIS — F418 Other specified anxiety disorders: Secondary | ICD-10-CM | POA: Diagnosis not present

## 2023-07-14 DIAGNOSIS — Z125 Encounter for screening for malignant neoplasm of prostate: Secondary | ICD-10-CM | POA: Diagnosis not present

## 2023-07-14 DIAGNOSIS — Z79899 Other long term (current) drug therapy: Secondary | ICD-10-CM | POA: Diagnosis not present

## 2023-07-14 DIAGNOSIS — F325 Major depressive disorder, single episode, in full remission: Secondary | ICD-10-CM | POA: Diagnosis not present

## 2023-07-14 DIAGNOSIS — E782 Mixed hyperlipidemia: Secondary | ICD-10-CM | POA: Diagnosis not present

## 2023-07-14 DIAGNOSIS — Z0000 Encounter for general adult medical examination without abnormal findings: Secondary | ICD-10-CM | POA: Diagnosis not present

## 2023-07-27 DIAGNOSIS — M545 Low back pain, unspecified: Secondary | ICD-10-CM | POA: Diagnosis not present

## 2023-07-27 DIAGNOSIS — W19XXXA Unspecified fall, initial encounter: Secondary | ICD-10-CM | POA: Diagnosis not present

## 2023-08-09 DIAGNOSIS — Z6833 Body mass index (BMI) 33.0-33.9, adult: Secondary | ICD-10-CM | POA: Diagnosis not present

## 2023-08-09 DIAGNOSIS — E6609 Other obesity due to excess calories: Secondary | ICD-10-CM | POA: Diagnosis not present

## 2023-08-09 DIAGNOSIS — E1142 Type 2 diabetes mellitus with diabetic polyneuropathy: Secondary | ICD-10-CM | POA: Diagnosis not present

## 2023-08-09 DIAGNOSIS — I10 Essential (primary) hypertension: Secondary | ICD-10-CM | POA: Diagnosis not present

## 2023-11-02 DIAGNOSIS — M25561 Pain in right knee: Secondary | ICD-10-CM | POA: Diagnosis not present

## 2023-11-08 DIAGNOSIS — I10 Essential (primary) hypertension: Secondary | ICD-10-CM | POA: Diagnosis not present

## 2023-11-08 DIAGNOSIS — E1142 Type 2 diabetes mellitus with diabetic polyneuropathy: Secondary | ICD-10-CM | POA: Diagnosis not present

## 2023-11-08 DIAGNOSIS — E6609 Other obesity due to excess calories: Secondary | ICD-10-CM | POA: Diagnosis not present

## 2023-11-09 DIAGNOSIS — M25561 Pain in right knee: Secondary | ICD-10-CM | POA: Diagnosis not present

## 2023-11-09 DIAGNOSIS — M25551 Pain in right hip: Secondary | ICD-10-CM | POA: Diagnosis not present

## 2023-11-22 DIAGNOSIS — D485 Neoplasm of uncertain behavior of skin: Secondary | ICD-10-CM | POA: Diagnosis not present

## 2023-11-22 DIAGNOSIS — L814 Other melanin hyperpigmentation: Secondary | ICD-10-CM | POA: Diagnosis not present

## 2023-11-22 DIAGNOSIS — D225 Melanocytic nevi of trunk: Secondary | ICD-10-CM | POA: Diagnosis not present

## 2023-11-22 DIAGNOSIS — L728 Other follicular cysts of the skin and subcutaneous tissue: Secondary | ICD-10-CM | POA: Diagnosis not present

## 2023-11-22 DIAGNOSIS — L821 Other seborrheic keratosis: Secondary | ICD-10-CM | POA: Diagnosis not present

## 2023-11-22 DIAGNOSIS — C4359 Malignant melanoma of other part of trunk: Secondary | ICD-10-CM | POA: Diagnosis not present

## 2023-12-01 DIAGNOSIS — M25561 Pain in right knee: Secondary | ICD-10-CM | POA: Diagnosis not present

## 2023-12-07 DIAGNOSIS — M25561 Pain in right knee: Secondary | ICD-10-CM | POA: Diagnosis not present

## 2023-12-22 DIAGNOSIS — C4359 Malignant melanoma of other part of trunk: Secondary | ICD-10-CM | POA: Diagnosis not present

## 2023-12-22 DIAGNOSIS — L905 Scar conditions and fibrosis of skin: Secondary | ICD-10-CM | POA: Diagnosis not present

## 2024-01-18 DIAGNOSIS — H52223 Regular astigmatism, bilateral: Secondary | ICD-10-CM | POA: Diagnosis not present

## 2024-01-18 DIAGNOSIS — H524 Presbyopia: Secondary | ICD-10-CM | POA: Diagnosis not present

## 2024-01-18 DIAGNOSIS — H50012 Monocular esotropia, left eye: Secondary | ICD-10-CM | POA: Diagnosis not present

## 2024-01-18 DIAGNOSIS — E119 Type 2 diabetes mellitus without complications: Secondary | ICD-10-CM | POA: Diagnosis not present

## 2024-01-21 DIAGNOSIS — Z23 Encounter for immunization: Secondary | ICD-10-CM | POA: Diagnosis not present

## 2024-02-07 DIAGNOSIS — H6123 Impacted cerumen, bilateral: Secondary | ICD-10-CM | POA: Diagnosis not present

## 2024-03-06 DIAGNOSIS — M545 Low back pain, unspecified: Secondary | ICD-10-CM | POA: Diagnosis not present

## 2024-03-06 DIAGNOSIS — M25551 Pain in right hip: Secondary | ICD-10-CM | POA: Diagnosis not present

## 2024-03-08 DIAGNOSIS — E6609 Other obesity due to excess calories: Secondary | ICD-10-CM | POA: Diagnosis not present

## 2024-03-08 DIAGNOSIS — I10 Essential (primary) hypertension: Secondary | ICD-10-CM | POA: Diagnosis not present

## 2024-03-08 DIAGNOSIS — E1142 Type 2 diabetes mellitus with diabetic polyneuropathy: Secondary | ICD-10-CM | POA: Diagnosis not present
